# Patient Record
Sex: Male | Born: 1955 | Race: White | Hispanic: No | Marital: Married | Smoking: Never smoker
Health system: Southern US, Community
[De-identification: ages and names within clinical notes are randomized; demographics above are authoritative.]

## PROBLEM LIST (undated history)

## (undated) DIAGNOSIS — F32A Depression, unspecified: Secondary | ICD-10-CM

## (undated) DIAGNOSIS — T7840XA Allergy, unspecified, initial encounter: Secondary | ICD-10-CM

## (undated) DIAGNOSIS — G4730 Sleep apnea, unspecified: Secondary | ICD-10-CM

## (undated) DIAGNOSIS — E785 Hyperlipidemia, unspecified: Secondary | ICD-10-CM

## (undated) DIAGNOSIS — I10 Essential (primary) hypertension: Secondary | ICD-10-CM

## (undated) DIAGNOSIS — K219 Gastro-esophageal reflux disease without esophagitis: Secondary | ICD-10-CM

## (undated) DIAGNOSIS — M7550 Bursitis of unspecified shoulder: Secondary | ICD-10-CM

## (undated) DIAGNOSIS — M1990 Unspecified osteoarthritis, unspecified site: Secondary | ICD-10-CM

## (undated) DIAGNOSIS — G4733 Obstructive sleep apnea (adult) (pediatric): Secondary | ICD-10-CM

## (undated) HISTORY — DX: Unspecified osteoarthritis, unspecified site: M19.90

## (undated) HISTORY — PX: TONSILLECTOMY AND ADENOIDECTOMY: SHX28

## (undated) HISTORY — DX: Obstructive sleep apnea (adult) (pediatric): G47.33

## (undated) HISTORY — DX: Hyperlipidemia, unspecified: E78.5

## (undated) HISTORY — DX: Bursitis of unspecified shoulder: M75.50

## (undated) HISTORY — DX: Gastro-esophageal reflux disease without esophagitis: K21.9

## (undated) HISTORY — DX: Depression, unspecified: F32.A

## (undated) HISTORY — DX: Sleep apnea, unspecified: G47.30

## (undated) HISTORY — DX: Allergy, unspecified, initial encounter: T78.40XA

## (undated) HISTORY — DX: Essential (primary) hypertension: I10

---

## 2003-07-08 HISTORY — PX: ANTERIOR CRUCIATE LIGAMENT REPAIR: SHX115

## 2007-06-18 ENCOUNTER — Ambulatory Visit: Payer: Self-pay | Admitting: Family Medicine

## 2007-06-18 DIAGNOSIS — I10 Essential (primary) hypertension: Secondary | ICD-10-CM

## 2007-06-21 ENCOUNTER — Encounter: Payer: Self-pay | Admitting: Family Medicine

## 2007-06-21 LAB — CONVERTED CEMR LAB
AST: 26 units/L (ref 0–37)
Alkaline Phosphatase: 82 units/L (ref 39–117)
BUN: 18 mg/dL (ref 6–23)
Cholesterol: 222 mg/dL — ABNORMAL HIGH (ref 0–200)
Glucose, Bld: 101 mg/dL — ABNORMAL HIGH (ref 70–99)
LDL Cholesterol: 148 mg/dL — ABNORMAL HIGH (ref 0–99)
PSA: 0.28 ng/mL (ref 0.10–4.00)
Total Bilirubin: 0.9 mg/dL (ref 0.3–1.2)
Total Protein: 7.6 g/dL (ref 6.0–8.3)
Triglycerides: 150 mg/dL — ABNORMAL HIGH (ref <150)
VLDL: 30 mg/dL (ref 0–40)

## 2007-06-22 ENCOUNTER — Encounter: Payer: Self-pay | Admitting: Family Medicine

## 2007-07-30 ENCOUNTER — Ambulatory Visit: Payer: Self-pay | Admitting: Family Medicine

## 2007-07-30 DIAGNOSIS — E785 Hyperlipidemia, unspecified: Secondary | ICD-10-CM

## 2007-08-05 ENCOUNTER — Encounter: Payer: Self-pay | Admitting: Family Medicine

## 2007-08-09 LAB — CONVERTED CEMR LAB
BUN: 18 mg/dL (ref 6–23)
CO2: 22 meq/L (ref 19–32)
Calcium: 9.6 mg/dL (ref 8.4–10.5)
Glucose, Bld: 91 mg/dL (ref 70–99)
Potassium: 4.1 meq/L (ref 3.5–5.3)

## 2007-08-17 ENCOUNTER — Encounter: Payer: Self-pay | Admitting: Family Medicine

## 2007-08-17 DIAGNOSIS — G4730 Sleep apnea, unspecified: Secondary | ICD-10-CM | POA: Insufficient documentation

## 2007-09-11 ENCOUNTER — Encounter: Payer: Self-pay | Admitting: Family Medicine

## 2007-09-22 ENCOUNTER — Encounter: Payer: Self-pay | Admitting: Family Medicine

## 2007-10-29 ENCOUNTER — Ambulatory Visit: Payer: Self-pay | Admitting: Family Medicine

## 2007-10-29 DIAGNOSIS — R7301 Impaired fasting glucose: Secondary | ICD-10-CM | POA: Insufficient documentation

## 2007-11-02 ENCOUNTER — Telehealth: Payer: Self-pay | Admitting: Family Medicine

## 2008-01-31 ENCOUNTER — Ambulatory Visit: Payer: Self-pay | Admitting: Family Medicine

## 2008-02-02 ENCOUNTER — Encounter: Payer: Self-pay | Admitting: Family Medicine

## 2008-02-04 LAB — CONVERTED CEMR LAB
ALT: 27 units/L (ref 0–53)
AST: 23 units/L (ref 0–37)
BUN: 22 mg/dL (ref 6–23)
CO2: 23 meq/L (ref 19–32)
Potassium: 4.4 meq/L (ref 3.5–5.3)
Total Bilirubin: 0.7 mg/dL (ref 0.3–1.2)
Total Protein: 7.7 g/dL (ref 6.0–8.3)

## 2008-04-07 ENCOUNTER — Ambulatory Visit: Payer: Self-pay | Admitting: Family Medicine

## 2008-04-28 ENCOUNTER — Ambulatory Visit: Payer: Self-pay | Admitting: Family Medicine

## 2008-05-12 ENCOUNTER — Encounter: Payer: Self-pay | Admitting: Family Medicine

## 2008-05-31 ENCOUNTER — Encounter: Payer: Self-pay | Admitting: Family Medicine

## 2008-06-06 ENCOUNTER — Encounter: Payer: Self-pay | Admitting: Family Medicine

## 2008-07-20 ENCOUNTER — Encounter: Payer: Self-pay | Admitting: Family Medicine

## 2008-07-27 ENCOUNTER — Ambulatory Visit: Payer: Self-pay | Admitting: Family Medicine

## 2008-07-27 DIAGNOSIS — J309 Allergic rhinitis, unspecified: Secondary | ICD-10-CM | POA: Insufficient documentation

## 2009-04-19 ENCOUNTER — Ambulatory Visit: Payer: Self-pay | Admitting: Family Medicine

## 2009-04-19 DIAGNOSIS — M5416 Radiculopathy, lumbar region: Secondary | ICD-10-CM

## 2009-07-01 ENCOUNTER — Emergency Department (HOSPITAL_COMMUNITY): Admission: EM | Admit: 2009-07-01 | Discharge: 2009-07-01 | Payer: Self-pay | Admitting: Emergency Medicine

## 2009-07-20 ENCOUNTER — Encounter: Payer: Self-pay | Admitting: Family Medicine

## 2009-10-13 ENCOUNTER — Emergency Department (HOSPITAL_COMMUNITY): Admission: EM | Admit: 2009-10-13 | Discharge: 2009-10-13 | Payer: Self-pay | Admitting: Emergency Medicine

## 2009-10-19 ENCOUNTER — Encounter: Payer: Self-pay | Admitting: Family Medicine

## 2009-12-01 ENCOUNTER — Ambulatory Visit: Payer: Self-pay | Admitting: Family Medicine

## 2009-12-01 DIAGNOSIS — L02419 Cutaneous abscess of limb, unspecified: Secondary | ICD-10-CM | POA: Insufficient documentation

## 2009-12-01 DIAGNOSIS — L03119 Cellulitis of unspecified part of limb: Secondary | ICD-10-CM

## 2009-12-21 ENCOUNTER — Encounter: Payer: Self-pay | Admitting: Family Medicine

## 2010-05-04 ENCOUNTER — Encounter: Payer: Self-pay | Admitting: Family Medicine

## 2010-05-20 ENCOUNTER — Encounter: Payer: Self-pay | Admitting: Family Medicine

## 2010-06-14 ENCOUNTER — Ambulatory Visit: Payer: Self-pay | Admitting: Family Medicine

## 2010-06-14 DIAGNOSIS — J0100 Acute maxillary sinusitis, unspecified: Secondary | ICD-10-CM

## 2010-06-21 ENCOUNTER — Ambulatory Visit: Payer: Self-pay | Admitting: Family Medicine

## 2010-06-21 ENCOUNTER — Encounter: Payer: Self-pay | Admitting: Family Medicine

## 2010-06-24 LAB — CONVERTED CEMR LAB
Albumin: 4.5 g/dL (ref 3.5–5.2)
Alkaline Phosphatase: 76 units/L (ref 39–117)
CO2: 25 meq/L (ref 19–32)
Calcium: 10 mg/dL (ref 8.4–10.5)
Chloride: 103 meq/L (ref 96–112)
HDL: 40 mg/dL (ref >39)
LDL Cholesterol: 132 mg/dL — ABNORMAL HIGH (ref 0–99)
Potassium: 4.7 meq/L (ref 3.5–5.3)
Total Bilirubin: 0.5 mg/dL (ref 0.3–1.2)
Total CHOL/HDL Ratio: 4.8
Triglycerides: 97 mg/dL (ref <150)

## 2010-07-31 ENCOUNTER — Encounter: Payer: Self-pay | Admitting: Family Medicine

## 2010-08-06 NOTE — Letter (Signed)
Summary: [facility]  [facility]   Imported By: Lanelle Bal 10/31/2009 09:13:42  _____________________________________________________________________  External Attachment:    Type:   Image     Comment:   External Document

## 2010-08-06 NOTE — Letter (Signed)
Summary: [facility]  [facility]   Imported By: Lanelle Bal 06/03/2010 09:59:21  _____________________________________________________________________  External Attachment:    Type:   Image     Comment:   External Document

## 2010-08-06 NOTE — Letter (Signed)
Summary: [facility]  [facility]   Imported By: Lanelle Bal 08/07/2009 13:51:41  _____________________________________________________________________  External Attachment:    Type:   Image     Comment:   External Document

## 2010-08-06 NOTE — Assessment & Plan Note (Signed)
Summary: Bites on L Lower Leg x 3 dys rm 3   Vital Signs:  Patient Profile:   55 Years Old Male CC:      Bite on L Lower leg x 3dys Height:     73.1 inches Weight:      275 pounds O2 Sat:      100 % O2 treatment:    Room Air Temp:     97.0 degrees F oral Pulse rate:   71 / minute Pulse rhythm:   regular Resp:     18 per minute BP sitting:   125 / 88  (right arm) Cuff size:   regular  Vitals Entered By: Mitchell Medina CMA (Dec 01, 2009 9:19 AM)                  Current Allergies: No known allergies History of Present Illness Chief Complaint: Bite on L Lower leg x 3dys History of Present Illness: Subjective:  Patient complains of 2 day history of "bug bites" right lower leg.  He feels well; no fevers, chills, and sweats.  Area in question is slightly sore but not painful. He is s/p right knee surgery, discharged from [facility] on 10/19/09 (orthopedist Dr. Heinz Medina).  He underwent a medial meniscocapsular separation injury repair, MCL ligament repair, and patella tendon repair, and has been recovering without incident. His wife reports that he has had a MRSA infection in the past.  Current Problems: CELLULITIS, LEG, RIGHT (ICD-682.6) LUMBAR STRAIN (ICD-847.2) ALLERGIC RHINITIS (ICD-477.9) IMPAIRED FASTING GLUCOSE (ICD-790.21) SLEEP APNEA (ICD-780.57) DYSLIPIDEMIA (ICD-272.4) ESSENTIAL HYPERTENSION, BENIGN (ICD-401.1)   Current Meds LISINOPRIL-HYDROCHLOROTHIAZIDE 20-12.5 MG  TABS (LISINOPRIL-HYDROCHLOROTHIAZIDE) 1 tab by mouth daily LOVASTATIN 40 MG  TABS (LOVASTATIN) 1 tab by mouth qhs ASPIRIN ADULT LOW STRENGTH 81 MG  TBEC (ASPIRIN) 1 tab by mouth daily NASONEX 50 MCG/ACT SUSP (MOMETASONE FUROATE) 2 sprays per nostril daily ETODOLAC 500 MG TABS (ETODOLAC) 1 tab by mouth two times a day with food x 10 days CYCLOBENZAPRINE HCL 10 MG TABS (CYCLOBENZAPRINE HCL) 1 tab by mouth at bedtime as needed back pain LOVENOX 30 MG/0.3ML SOLN (ENOXAPARIN SODIUM) 1  tab by mouth once daily MINOCYCLINE HCL 100 MG CAPS (MINOCYCLINE HCL) 1 by mouth two times a day  REVIEW OF SYSTEMS Constitutional Symptoms      Denies fever, chills, night sweats, weight loss, weight gain, and fatigue.  Eyes       Denies change in vision, eye pain, eye discharge, glasses, contact lenses, and eye surgery. Ear/Nose/Throat/Mouth       Denies hearing loss/aids, change in hearing, ear pain, ear discharge, dizziness, frequent runny nose, frequent nose bleeds, sinus problems, sore throat, hoarseness, and tooth pain or bleeding.  Respiratory       Denies dry cough, productive cough, wheezing, shortness of breath, asthma, bronchitis, and emphysema/COPD.  Cardiovascular       Denies murmurs, chest pain, and tires easily with exhertion.    Gastrointestinal       Denies stomach pain, nausea/vomiting, diarrhea, constipation, blood in bowel movements, and indigestion. Genitourniary       Denies painful urination, kidney stones, and loss of urinary control. Neurological       Denies paralysis, seizures, and fainting/blackouts. Musculoskeletal       Denies muscle pain, joint pain, joint stiffness, decreased range of motion, redness, swelling, muscle weakness, and gout.  Skin       Denies bruising, unusual mles/lumps or sores, and hair/skin or nail changes.  Psych  Denies mood changes, temper/anger issues, anxiety/stress, speech problems, depression, and sleep problems. Other Comments: Bites on L Lower leg x  3dys. Pt has not seen PCP for this.   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 Lives with Mitchell Medina.  No children.  Never smoked. 2 ETOH/ month.   Exercises 2 days a wk. Fair diet.    Risk Factors: Caffeine Use: 0 (July 17, 2007)   Objective:  No acute distress  Right Knee:  multiple  healed surgical wounds without swelling, tenderness, erythema, or warmth Right lower leg pre-tibial area:  5cm patch of erythema with several 1 to 2mm dia pustules present.  No swelling or fluctuance.  Minimal lower leg edema.  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  AREA OF CELLULITIS APPEARS TO BE AT A CONTACT POINT FOR HIS HINGED KNEE BRACE.  Plan New Medications/Changes: MINOCYCLINE HCL 100 MG CAPS (MINOCYCLINE HCL) 1 by mouth two times a day  #20 x 0, 12/01/2009, Mitchell Christen MD  New Orders: T-Culture, Wound [[id number]] New Patient Level III [phone] Planning Comments:   Area of cellulitis outlined with marking pen.   Wound culture taken.  Bandage applied (advised to keep bandage on wound and change daily). Begin minocycline 100mg  two times a day for 10 days Recommend that he remove contact pads from his knee brace and wash in warm water with detergent and Chlorox bleach solution, then dry thoroughly Return (or go to ER) for increasing pain, swelling, fever, and especially any pain/swelling in right knee. For the next 2 to 3 days, recommend that he minimize ambulation, elevate leg, and apply dry heat in order to maximize circulation to the site of cellulitis. Recommend that he follow-up with his orthopedist in 2 to 4 days.   The patient and/or caregiver has been counseled thoroughly with regard to medications prescribed including dosage, schedule, interactions, rationale for use, and possible side effects and they verbalize understanding.  Diagnoses and expected course of recovery discussed and will return if not improved as expected or if the  condition worsens. Patient and/or caregiver verbalized understanding.  Prescriptions: MINOCYCLINE HCL 100 MG CAPS (MINOCYCLINE HCL) 1 by mouth two times a day  #20 x 0   Entered and Authorized by:   Mitchell Christen MD   Signed by:   Mitchell Christen MD on 12/01/2009   Method used:   Print then Give to Patient   RxID:   [id number]

## 2010-08-06 NOTE — Assessment & Plan Note (Signed)
Summary: sinusitis   Vital Signs:  Patient profile:   55 year old male Height:      73.1 inches Weight:      265 pounds BMI:     34.99 O2 Sat:      99 % on Room air Temp:     98.2 degrees F oral Pulse rate:   76 / minute BP sitting:   139 / 89  (left arm) Cuff size:   regular  Vitals Entered By: Payton Spark CMA (June 14, 2010 3:54 PM)  O2 Flow:  Room air CC: Congestion, ST and cough x 2 days.   Primary Care Desmon Hitchner:  Seymour Bars DO  CC:  Congestion and ST and cough x 2 days.Marland Kitchen  History of Present Illness: 55 yo WM presents for sore throat, congestion and cough x 2 days.  He has had several sinus infections.  He is having HAs.  Had f/c last night.  He is taking Aspirin and Vitamin C.  The cough kept him up last night.  Has sinus pressure behind his nose.  Has mostly clear/ yellow rhinorrhea.  No sick contacts.  No GI upset but is having more frequent BMs.  He has had frequent sinus infection with yearlong allergies.  Has been taking Claritin D but it seems to not be helping for his allergies this year.      Current Medications (verified): 1)  Lisinopril-Hydrochlorothiazide 20-12.5 Mg  Tabs (Lisinopril-Hydrochlorothiazide) .Marland Kitchen.. 1 Tab By Mouth Daily 2)  Lovastatin 40 Mg  Tabs (Lovastatin) .Marland Kitchen.. 1 Tab By Mouth Qhs 3)  Aspirin Adult Low Strength 81 Mg  Tbec (Aspirin) .Marland Kitchen.. 1 Tab By Mouth Daily 4)  Claritin-D 24 Hour 10-240 Mg Xr24h-Tab (Loratadine-Pseudoephedrine)  Allergies (verified): No Known Drug Allergies  Past History:  Past Medical History: Reviewed history from 07/27/2008 and no changes required. HTN high chol on CPAP for OSA (severe) hx of childhood asthma hx of shoulder bursitis chronic allergies  Past Surgical History: ACL removed 2005 (L); ACL repair on the R 2011 T&A  Social History: Reviewed history from 06/18/2007 and no changes required. 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 Lives with [name].  No children.  Never  smoked. 2 ETOH/ month.   Exercises 2 days a wk. Fair diet.    Review of Systems      See HPI  Physical Exam  General:  alert, well-developed, well-nourished, and well-hydrated.   Head:  normocephalic and atraumatic.  maxilary sinuses NTTP Eyes:  conjunctiva clear; slightly watery Ears:  EACs patent; TMs translucent and gray with good cone of light and bony landmarks.  Nose:  nasal congestion with clear rhinorrhea and boggy turbinates Mouth:  o/p injected with cobblestoning. Lungs:  normal respiratory effort, no intercostal retractions, no accessory muscle use, and normal breath sounds.   Heart:  normal rate, regular rhythm, and no murmur.   Extremities:  no LE edema Skin:  color normal.   Cervical Nodes:  shotty anterior cervical chain LA Psych:  good eye contact, not anxious appearing, and not depressed appearing.     Impression & Recommendations:  Problem # 1:  ACUTE MAXILLARY SINUSITIS (ICD-461.0) Likely secondary to poorly treated underlying allergies and URI.   Will start on Amoxicillin x 10 days + Mucinex D in AM and RX cough syrup at night. Call if not improved after 10 days. The following medications were removed from the medication list:    Nasonex 50 Mcg/act Susp (Mometasone furoate) .Marland KitchenMarland KitchenMarland KitchenMarland Kitchen 2  sprays per nostril daily    Minocycline Hcl 100 Mg Caps (Minocycline hcl) .Marland Kitchen... 1 by mouth two times a day His updated medication list for this problem includes:    Claritin-d 24 Hour 10-240 Mg Xr24h-tab (Loratadine-pseudoephedrine)    Amoxicillin 875 Mg Tabs (Amoxicillin) .Marland Kitchen... 1 tab by mouth two times a day x 10 days    Promethazine Vc/codeine 6.25-5-10 Mg/15ml Syrp (Phenyleph-promethazine-cod) .Marland Kitchen... 5-10 ml by mouth q hs as needed cough/ congestion    Mucinex D 60-600 Mg Xr12h-tab (Pseudoephedrine-guaifenesin) .Marland Kitchen... 1 tab by mouth qam  Complete Medication List: 1)  Lisinopril-hydrochlorothiazide 20-12.5 Mg Tabs (Lisinopril-hydrochlorothiazide) .Marland Kitchen.. 1 tab by mouth daily 2)   Lovastatin 40 Mg Tabs (Lovastatin) .Marland Kitchen.. 1 tab by mouth qhs 3)  Aspirin Adult Low Strength 81 Mg Tbec (Aspirin) .Marland Kitchen.. 1 tab by mouth daily 4)  Claritin-d 24 Hour 10-240 Mg Xr24h-tab (Loratadine-pseudoephedrine) 5)  Amoxicillin 875 Mg Tabs (Amoxicillin) .Marland Kitchen.. 1 tab by mouth two times a day x 10 days 6)  Promethazine Vc/codeine 6.25-5-10 Mg/16ml Syrp (Phenyleph-promethazine-cod) .... 5-10 ml by mouth q hs as needed cough/ congestion 7)  Mucinex D 60-600 Mg Xr12h-tab (Pseudoephedrine-guaifenesin) .Marland Kitchen.. 1 tab by mouth qam  Patient Instructions: 1)  Take Mucinex D + Advil in the morning. 2)  Use RX cough syrup at bedtime with more Advil if needed. 3)  Take 10 days of Amoxicillin for sinusitis. 4)  Hold Claritin D for now. Prescriptions: PROMETHAZINE VC/CODEINE 6.25-5-10 MG/5ML SYRP (PHENYLEPH-PROMETHAZINE-COD) 5-10 ml by mouth q hs as needed cough/ congestion  #150 ml x 0   Entered and Authorized by:   Seymour Bars DO   Signed by:   Seymour Bars DO on 06/14/2010   Method used:   Printed then faxed to ...       [facility] [phone]* (retail)       [address] [place], [place]  [address]       Ph: [phone]       Fax: [phone]   RxID:   [id number] AMOXICILLIN 875 MG TABS (AMOXICILLIN) 1 tab by mouth two times a day x 10 days  #20 x 0   Entered and Authorized by:   Seymour Bars DO   Signed by:   Seymour Bars DO on 06/14/2010   Method used:   Electronically to        [facility] [phone]* (retail)       [address] [place], [place]  [address]       Ph: [phone]       Fax: [phone]   RxID:   [id number]    Orders Added: 1)  Est. Patient Level III [62376]
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
## 2010-08-15 ENCOUNTER — Encounter: Payer: Self-pay | Admitting: Family Medicine

## 2010-08-15 ENCOUNTER — Ambulatory Visit (INDEPENDENT_AMBULATORY_CARE_PROVIDER_SITE_OTHER): Payer: BC Managed Care – PPO | Admitting: Family Medicine

## 2010-08-15 DIAGNOSIS — E785 Hyperlipidemia, unspecified: Secondary | ICD-10-CM

## 2010-08-15 DIAGNOSIS — R7301 Impaired fasting glucose: Secondary | ICD-10-CM

## 2010-08-15 DIAGNOSIS — I10 Essential (primary) hypertension: Secondary | ICD-10-CM

## 2010-08-15 LAB — CONVERTED CEMR LAB: LDL Goal: 130 mg/dL

## 2010-08-16 ENCOUNTER — Ambulatory Visit: Payer: Self-pay | Admitting: Family Medicine

## 2010-08-16 LAB — CONVERTED CEMR LAB
Bilirubin, Direct: 0.1 mg/dL (ref 0.0–0.3)
Cholesterol: 142 mg/dL (ref 0–200)
HDL: 38 mg/dL — ABNORMAL LOW (ref >39)
LDL Cholesterol: 69 mg/dL (ref 0–99)
Total Bilirubin: 0.6 mg/dL (ref 0.3–1.2)
Total CHOL/HDL Ratio: 3.7
Total Protein: 7.2 g/dL (ref 6.0–8.3)
VLDL: 35 mg/dL (ref 0–40)

## 2010-08-22 NOTE — Assessment & Plan Note (Signed)
Summary: f/u cholesterol   Vital Signs:  Patient profile:   55 year old male Height:      73.1 inches Weight:      262 pounds BMI:     34.60 O2 Sat:      100 % on Room air Pulse rate:   61 / minute BP sitting:   123 / 83  (left arm) Cuff size:   large  Vitals Entered By: Payton Spark CMA (August 15, 2010 8:47 AM)  O2 Flow:  Room air CC: F/U cholesterol , Lipid Management, Hypertension Management   CC:  F/U cholesterol , Lipid Management, and Hypertension Management.  Hypertension History:      He denies chest pain, dyspnea with exertion, peripheral edema, visual symptoms, and side effects from treatment.  He notes no problems with any antihypertensive medication side effects.        Positive major cardiovascular risk factors include male age 11 years old or older, hyperlipidemia, and hypertension.  Negative major cardiovascular risk factors include no history of diabetes and negative family history for ischemic heart disease.        Further assessment for target organ damage reveals no history of ASHD, cardiac end-organ damage (CHF/LVH), stroke/TIA, peripheral vascular disease, renal insufficiency, or hypertensive retinopathy.    Lipid Management History:      Positive NCEP/ATP III risk factors include male age 36 years old or older and hypertension.  Negative NCEP/ATP III risk factors include non-diabetic, no family history for ischemic heart disease, no ASHD (atherosclerotic heart disease), no prior stroke/TIA, no peripheral vascular disease, and no history of aortic aneurysm.        Adjunctive measures started by the patient include aerobic exercise and weight reduction.  He expresses no side effects from his lipid-lowering medication.  The patient denies any symptoms to suggest myopathy or liver disease.      Current Medications (verified): 1)  Lisinopril-Hydrochlorothiazide 20-12.5 Mg  Tabs (Lisinopril-Hydrochlorothiazide) .Marland Kitchen.. 1 Tab By Mouth Daily 2)  Crestor 10 Mg Tabs  (Rosuvastatin Calcium) .Marland Kitchen.. 1 Tab By Mouth Qhs 3)  Aspirin Adult Low Strength 81 Mg  Tbec (Aspirin) .Marland Kitchen.. 1 Tab By Mouth Daily 4)  Claritin-D 24 Hour 10-240 Mg Xr24h-Tab (Loratadine-Pseudoephedrine) 5)  Omnaris 50 Mcg/act Susp (Ciclesonide) .... 2 Sp / Nostril Daily  Allergies (verified): No Known Drug Allergies  Physical Exam  General:  alert, well-developed, well-nourished, well-hydrated, and overweight-appearing.   Lungs:  Normal respiratory effort, chest expands symmetrically. Lungs are clear to auscultation, no crackles or wheezes. Heart:  Normal rate and regular rhythm. S1 and S2 normal without gallop, murmur, click, rub or other extra sounds.   Impression & Recommendations:  Problem # 1:  DYSLIPIDEMIA (ICD-272.4)  Repeat FLP with Liver function today given new start Crestor 8 wks ago.  F/U results tomorrow.   His updated medication list for this problem includes:    Crestor 10 Mg Tabs (Rosuvastatin calcium) .Marland Kitchen... 1 tab by mouth qhs  Orders: T-Lipid Profile [phone]) T-Liver Profile [phone])  Labs Reviewed: SGOT: 21 (06/21/2010)   SGPT: 26 (06/21/2010)  Prior 10 Yr Risk Heart Disease: 9 % (07/30/2007)   HDL:40 (06/21/2010), 44 (06/21/2007)  LDL:132 (06/21/2010), 148 (06/21/2007)  Chol:191 (06/21/2010), 222 (06/21/2007)  Trig:97 (06/21/2010), 150 (06/21/2007)  Problem # 2:  ESSENTIAL HYPERTENSION, BENIGN (ICD-401.1) BP looks great!  Stay on current meds.  Labs UTD. His updated medication list for this problem includes:    Lisinopril-hydrochlorothiazide 20-12.5 Mg Tabs (Lisinopril-hydrochlorothiazide) .Marland Kitchen... 1 tab by mouth daily  BP today: 123/83 Prior BP: 135/90 (06/21/2010)  Prior 10 Yr Risk Heart Disease: 9 % (07/30/2007)  Labs Reviewed: K+: 4.7 (06/21/2010) Creat: : 0.97 (06/21/2010)   Chol: 191 (06/21/2010)   HDL: 40 (06/21/2010)   LDL: 132 (06/21/2010)   TG: 97 (06/21/2010)  Problem # 3:  IMPAIRED FASTING GLUCOSE (ICD-790.21) FAsting glucose 102 on Dec  2011 labs.   BMI 34.6 c/w class I obesity. He is eating healthier and exercising 7 days/ wk.  Recheck CBG at 6 mos f/u visit.  Complete Medication List: 1)  Lisinopril-hydrochlorothiazide 20-12.5 Mg Tabs (Lisinopril-hydrochlorothiazide) .Marland Kitchen.. 1 tab by mouth daily 2)  Crestor 10 Mg Tabs (Rosuvastatin calcium) .Marland Kitchen.. 1 tab by mouth qhs 3)  Aspirin Adult Low Strength 81 Mg Tbec (Aspirin) .Marland Kitchen.. 1 tab by mouth daily 4)  Claritin-d 24 Hour 10-240 Mg Xr24h-tab (Loratadine-pseudoephedrine) 5)  Omnaris 50 Mcg/act Susp (Ciclesonide) .... 2 sp / nostril daily  Hypertension Assessment/Plan:      The patient's hypertensive risk group is category B: At least one risk factor (excluding diabetes) with no target organ damage.  His calculated 10 year risk of coronary heart disease is 9 %.  Today's blood pressure is 123/83.  His blood pressure goal is < 140/90.  Lipid Assessment/Plan:      Based on NCEP/ATP III, the patient's risk factor category is "2 or more risk factors and a calculated 10 year CAD risk of < 20%".  The patient's lipid goals are as follows: Total cholesterol goal is 200; LDL cholesterol goal is 130; HDL cholesterol goal is 40; Triglyceride goal is 150.    Patient Instructions: 1)  Labs today. 2)  Will call you w/ results tomorrow. 3)  BP looks great. 4)  Keep up the good work with healthy diet and regular exercise! 5)  Return for f/u in 6 mos.   Orders Added: 1)  T-Lipid Profile [[id number]] 2)  T-Liver Profile [[id number]] 3)  Est. Patient Level III [04540]

## 2010-08-28 NOTE — Letter (Signed)
Summary: [facility]  [facility]   Imported By: Lanelle Bal 08/20/2010 13:20:41  _____________________________________________________________________  External Attachment:    Type:   Image     Comment:   External Document

## 2010-12-15 ENCOUNTER — Encounter: Payer: Self-pay | Admitting: Family Medicine

## 2010-12-20 ENCOUNTER — Encounter: Payer: Self-pay | Admitting: Family Medicine

## 2010-12-20 ENCOUNTER — Ambulatory Visit (INDEPENDENT_AMBULATORY_CARE_PROVIDER_SITE_OTHER): Payer: BC Managed Care – PPO | Admitting: Family Medicine

## 2010-12-20 DIAGNOSIS — R7301 Impaired fasting glucose: Secondary | ICD-10-CM

## 2010-12-20 DIAGNOSIS — E785 Hyperlipidemia, unspecified: Secondary | ICD-10-CM

## 2010-12-20 DIAGNOSIS — I10 Essential (primary) hypertension: Secondary | ICD-10-CM

## 2010-12-20 DIAGNOSIS — J309 Allergic rhinitis, unspecified: Secondary | ICD-10-CM

## 2010-12-20 LAB — GLUCOSE, POCT (MANUAL RESULT ENTRY): POC Glucose: 120

## 2010-12-20 NOTE — Assessment & Plan Note (Signed)
BP at goal on lisinopril - hctz.  Continue.  Labs UTD.

## 2010-12-20 NOTE — Assessment & Plan Note (Signed)
Doing well on crestor.  Labs UTD.  Continue.

## 2010-12-20 NOTE — Assessment & Plan Note (Signed)
Yearlong allergies since childhood. Change Claritin D to Zyrtec.  Did not see any benefit from [facility].  Given h/o on reducing allergen exposure.

## 2010-12-20 NOTE — Patient Instructions (Signed)
Stay on current meds.  Fasting sugar 120.  > 126 is the cut-off for diabetes.  Work on low sugar/ low carb diet + regular exercise.  REturn for nurse visit -- recheck sugar + A1C in 4 wks.  Change Claritin D to Zyrtec once daily.

## 2010-12-20 NOTE — Assessment & Plan Note (Signed)
Glucose 120 today c/w IFG, higher from previous.  Time to really adjust diet to low sugar/ low carb, continue regular exercise. Recheck with A1C here in 4 wks.  Given book for assistance on dietary changes.

## 2010-12-20 NOTE — Progress Notes (Signed)
  Subjective:    Patient ID: Mitchell Medina, male    DOB: [birth year], 55 y.o.   MRN: [id number]  HPI 55 yo WM presents for f/u HTN and high cholesterol.  He is struggling with seasonal allergies.  He is on Claritin D which has not been helping much this year for his yearlong allergies.  Denies chest pain, DOE.  He denies chest tightness, wheezing or nocturnal cough and has not had problems with asthma since childhood.  He is doing well with CPAP, Lisinopril/ HCTZ and Crestor.  Labs are UTD but we wanted to recheck his sugar which was in the IFG range last time.  He is exercising 5-6 days/ wk but has not changed his diet.  BP 122/79  Pulse 65  Ht 6' 0.75" (1.848 m)  Wt 259 lb (117.482 kg)  BMI 34.41 kg/m2  SpO2 100%   Review of Systems  Constitutional: Negative for fatigue and unexpected weight change.  HENT: Positive for congestion, rhinorrhea, sneezing and postnasal drip. Negative for ear pain.   Eyes: Negative for visual disturbance.  Respiratory: Negative for cough and shortness of breath.   Cardiovascular: Negative for chest pain, palpitations and leg swelling.  Genitourinary: Negative for frequency.       Objective:   Physical Exam  Constitutional: He appears well-developed and well-nourished.       overwt  HENT:  Nose: Nose normal.  Mouth/Throat: Oropharynx is clear and moist.  Eyes: Conjunctivae are normal. No scleral icterus.  Neck: Neck supple. No thyromegaly present.  Cardiovascular: Normal rate, regular rhythm and normal heart sounds.   No murmur heard. Pulmonary/Chest: Effort normal and breath sounds normal. No respiratory distress. He has no wheezes.  Abdominal: Soft. Bowel sounds are normal. He exhibits no distension. There is no tenderness.       No AA bruits  Musculoskeletal: He exhibits no edema.  Lymphadenopathy:    He has no cervical adenopathy.  Skin: Skin is warm and dry.  Psychiatric: He has a normal mood and affect.          Assessment & Plan:

## 2011-01-13 ENCOUNTER — Other Ambulatory Visit: Payer: Self-pay | Admitting: *Deleted

## 2011-01-13 MED ORDER — LISINOPRIL-HYDROCHLOROTHIAZIDE 20-12.5 MG PO TABS: 1.0000 | ORAL_TABLET | Freq: Every day | ORAL | Status: DC

## 2011-01-17 ENCOUNTER — Ambulatory Visit (INDEPENDENT_AMBULATORY_CARE_PROVIDER_SITE_OTHER): Payer: BC Managed Care – PPO | Admitting: Family Medicine

## 2011-01-17 ENCOUNTER — Telehealth: Payer: Self-pay | Admitting: Family Medicine

## 2011-01-17 DIAGNOSIS — J302 Other seasonal allergic rhinitis: Secondary | ICD-10-CM

## 2011-01-17 DIAGNOSIS — R7301 Impaired fasting glucose: Secondary | ICD-10-CM

## 2011-01-17 DIAGNOSIS — J309 Allergic rhinitis, unspecified: Secondary | ICD-10-CM

## 2011-01-17 LAB — GLUCOSE, POCT (MANUAL RESULT ENTRY): POC Glucose: 112

## 2011-01-17 NOTE — Telephone Encounter (Signed)
Pls let pt know that his A1C looks great at 5.9 incidating great control if his sugar.  Keep up the good work

## 2011-01-17 NOTE — Telephone Encounter (Signed)
Pt aware of the above  

## 2011-01-17 NOTE — Progress Notes (Signed)
  Subjective:    Patient ID: Mitchell Medina, male    DOB: [birth year], 55 y.o.   MRN: [id number]  HPI  Repeat fasting sugar and A1C due to elevated sugar last month. Pt also would like a suggestion on allergy med. He has tried claritin D and zyrtec D w/out relief. Please advise.    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 He needs to work on regular exercise, low sugar and low carb diet. Recheck lab work in 6 months.  Allergies he can try Allegra which is now over-the-counter. If this is not helping his symptoms then he probably needs an office visit with Dr. Cathey Endow to see if he needs to have a nasal steroid or other medications added to his regimen.

## 2011-02-10 ENCOUNTER — Ambulatory Visit (INDEPENDENT_AMBULATORY_CARE_PROVIDER_SITE_OTHER): Payer: BC Managed Care – PPO | Admitting: Family Medicine

## 2011-02-10 ENCOUNTER — Encounter: Payer: Self-pay | Admitting: Family Medicine

## 2011-02-10 VITALS — BP 118/79 | HR 79 | Temp 98.4°F | Wt 260.0 lb

## 2011-02-10 DIAGNOSIS — J019 Acute sinusitis, unspecified: Secondary | ICD-10-CM

## 2011-02-10 MED ORDER — ALBUTEROL SULFATE HFA 108 (90 BASE) MCG/ACT IN AERS: 2.0000 | INHALATION_SPRAY | Freq: Four times a day (QID) | RESPIRATORY_TRACT | Status: DC | PRN

## 2011-02-10 MED ORDER — AMOXICILLIN 875 MG PO TABS: 875.0000 mg | ORAL_TABLET | Freq: Two times a day (BID) | ORAL | Status: AC

## 2011-02-10 NOTE — Progress Notes (Signed)
  Subjective:    Patient ID: Mitchell Medina, male    DOB: 10/03/1955, 55 y.o.   MRN: [id number]  HPI 55 yo WM presents for a scratchy throat and and runny nose x 2 days.  He became very congested today. No F/C/  Has a HA today.  He has pressure bhind his eyes.  He has a hx of allergies and recurrent sinus infections.  He has never had sinus surgery.  He is taking Claritin and Tylenol.  Has a little cough.  No SOB but has a little chest tightness.  He has a hx of asthma but ran out of Albuterol HFA.  BP 118/79  Pulse 79  Temp(Src) 98.4 F (36.9 C) (Oral)  Wt 260 lb (117.935 kg)  SpO2 98%    Review of Systems  Constitutional: Positive for fatigue. Negative for fever and chills.  HENT: Positive for congestion, sore throat, rhinorrhea, postnasal drip and sinus pressure.   Eyes: Negative for itching.  Respiratory: Positive for chest tightness, shortness of breath and wheezing. Negative for cough.   Cardiovascular: Negative for chest pain and palpitations.  Gastrointestinal: Negative for nausea, abdominal pain and diarrhea.  Skin: Negative for rash.  Neurological: Positive for headaches.       Objective:   Physical Exam  Constitutional: He appears well-developed and well-nourished. No distress.  HENT:  Right Ear: External ear normal.  Left Ear: External ear normal.  Nose: Mucosal edema and rhinorrhea present. Right sinus exhibits maxillary sinus tenderness. Right sinus exhibits no frontal sinus tenderness. Left sinus exhibits maxillary sinus tenderness. Left sinus exhibits no frontal sinus tenderness.  Mouth/Throat: Posterior oropharyngeal erythema present. No oropharyngeal exudate or posterior oropharyngeal edema.  Eyes: Conjunctivae are normal.  Neck: Neck supple.  Cardiovascular: Normal rate, regular rhythm and normal heart sounds.   No murmur heard. Pulmonary/Chest: Effort normal and breath sounds normal. No respiratory distress. He has no wheezes.  Lymphadenopathy:    He has  cervical adenopathy.  Skin: Skin is warm and dry. No rash noted.          Assessment & Plan:  ABS likely secondary to underlying chronic allergies but may also have a viral nasopharyngitis. Will treat with Amoxicillin 875 mg bid x 10 days, added Veramyst nasal daily since he cannot take decongestants with HTN. Use tylenol or ibuprofen for aches/ pains.  Rest, clear luids and add Albterol HFA as needed for chest tightness and wheezing. Call if any problems.

## 2011-02-10 NOTE — Patient Instructions (Signed)
Stay on claritin daily. Use tylenol or ibuprofen for pain.  Take Amoxicillin 2 x a day for 10 days for sinusitis.  Take with food. Use nasal spray daily x 2 wks.  Use ProAir inhaler as needed for chest tightness and wheezing.  Call if not improving and/ or getting any worse.

## 2011-03-07 ENCOUNTER — Ambulatory Visit (INDEPENDENT_AMBULATORY_CARE_PROVIDER_SITE_OTHER): Payer: BC Managed Care – PPO | Admitting: Family Medicine

## 2011-03-07 ENCOUNTER — Telehealth: Payer: Self-pay | Admitting: Family Medicine

## 2011-03-07 ENCOUNTER — Encounter: Payer: Self-pay | Admitting: Family Medicine

## 2011-03-07 ENCOUNTER — Other Ambulatory Visit: Payer: Self-pay | Admitting: Emergency Medicine

## 2011-03-07 ENCOUNTER — Ambulatory Visit
Admission: RE | Admit: 2011-03-07 | Discharge: 2011-03-07 | Disposition: A | Discharge status: Home / Self Care | Payer: BC Managed Care – PPO | Source: Ambulatory Visit | Attending: Family Medicine | Admitting: Family Medicine

## 2011-03-07 VITALS — BP 118/80 | HR 66 | Wt 254.0 lb

## 2011-03-07 DIAGNOSIS — M545 Low back pain: Secondary | ICD-10-CM

## 2011-03-07 DIAGNOSIS — Z23 Encounter for immunization: Secondary | ICD-10-CM

## 2011-03-07 NOTE — Patient Instructions (Signed)
Restart Naproxen bid with food and water. Stop if any GI iritation Given flu vaccine today. We will call you with your xray results.

## 2011-03-07 NOTE — Progress Notes (Signed)
  Subjective:    Patient ID: Mitchell Medina, male    DOB: [birth year], 55 y.o.   MRN: [id number]  HPI Back pain on and off for 10 days. Usually resolves in 3-5 days but now had for a week. No known triggers with the episode. Did take some naproxen a few times.  Does a lot of stretches and uses an inversion table.  No initial injury 10 years ago. Pain is lumbar.  Can shot up the back but not usually down the legs. He feels it is more comfortable to stand and walk but feels it is affecting his gait. Has been doing a lot of core exercises. Says he would like to know exactly what going on. Today his pain is moderate. He actually has a little bit better today than earlier this week.    Review of Systems     Objective:   Physical Exam  Musculoskeletal:       Dec lumbar spine flexion, NOrmal extension, rotation, and side bending. Nontender over the lumbar spine, SI joints or paraspinous muscles. NEg straight leg raise but did have pain in his back with this.  Hip, knee, and ankle sterngth 5/5. Patellar reflexes 0 + bilat.    No extremity swelling. In general no acute distress. He was able to get up on examination table without any difficulty.       Assessment & Plan:  Acute on chronic lumbar back pain. NSAID, warned against GI effects.  Recommend gentle stretches. Offered PT. He wanted to hold off for one more week.  Will call. Will start with xray. If fairly normal then consider MRI for further eval. I did explain to him that an MRI may or may not change her actual therapy course. In is most likely that he does have some type of herniated disc. I discussed with him that the most important thing is to do his stretches and exercises and really strengthen his course so that he has fewer recurrences of pain but with a history of chronic back pain he will always have recurrences of back pain in the future. Our goal is to control these as much as possible. He declined a muscle relaxer.    Given flu  vaccine today.

## 2011-03-07 NOTE — Telephone Encounter (Signed)
Pt.notified

## 2011-03-07 NOTE — Telephone Encounter (Signed)
Call pt: Degenerative changes in the lumbar spine on the xray. Does he wasn't to move forward with an MRI?

## 2011-03-12 ENCOUNTER — Telehealth: Payer: Self-pay | Admitting: *Deleted

## 2011-03-12 DIAGNOSIS — M545 Low back pain: Secondary | ICD-10-CM

## 2011-03-12 NOTE — Telephone Encounter (Signed)
MRI entered 

## 2011-03-12 NOTE — Telephone Encounter (Signed)
Pt calls and would like to proceed with getting the MRI. Please enter order or referral.

## 2011-03-14 ENCOUNTER — Ambulatory Visit
Admission: RE | Admit: 2011-03-14 | Discharge: 2011-03-14 | Disposition: A | Discharge status: Home / Self Care | Payer: BC Managed Care – PPO | Source: Ambulatory Visit | Attending: Family Medicine | Admitting: Family Medicine

## 2011-03-14 DIAGNOSIS — M545 Low back pain: Secondary | ICD-10-CM

## 2011-03-17 ENCOUNTER — Telehealth: Payer: Self-pay | Admitting: Family Medicine

## 2011-03-17 NOTE — Telephone Encounter (Signed)
Pt aware and would like referral to ortho

## 2011-03-17 NOTE — Telephone Encounter (Signed)
Call patient: He does have disc protrusions at L4-L5 and L5-S1. These are producing some on the thecal sac. They felt like him to consider seeing an orthopedist or neurosurgeon for evaluation to see if they recommend any further treatment. Please let me know if he has a preference.

## 2011-09-29 ENCOUNTER — Encounter: Payer: Self-pay | Admitting: Family Medicine

## 2011-09-29 ENCOUNTER — Ambulatory Visit (INDEPENDENT_AMBULATORY_CARE_PROVIDER_SITE_OTHER): Payer: Self-pay | Admitting: Family Medicine

## 2011-09-29 VITALS — BP 142/80 | HR 71 | Temp 98.4°F | Ht 72.75 in | Wt 267.0 lb

## 2011-09-29 DIAGNOSIS — J329 Chronic sinusitis, unspecified: Secondary | ICD-10-CM

## 2011-09-29 DIAGNOSIS — J309 Allergic rhinitis, unspecified: Secondary | ICD-10-CM

## 2011-09-29 MED ORDER — LEVOCETIRIZINE DIHYDROCHLORIDE 5 MG PO TABS: 5.0000 mg | ORAL_TABLET | Freq: Every evening | ORAL | Status: DC

## 2011-09-29 MED ORDER — AMOXICILLIN-POT CLAVULANATE 875-125 MG PO TABS: 1.0000 | ORAL_TABLET | Freq: Two times a day (BID) | ORAL | Status: AC

## 2011-09-29 MED ORDER — FLUTICASONE PROPIONATE 50 MCG/ACT NA SUSP: 2.0000 | Freq: Every day | NASAL | Status: DC

## 2011-09-29 NOTE — Progress Notes (Signed)
  Subjective:    Patient ID: Mitchell Medina, male    DOB: 1956/03/31, 56 y.o.   MRN: [id number]  HPI Sinus pain bilaterally but mostly on he right over the eye. Yellow/green discharge. + cough, post nasal drip.  No fever.  Not taking any meds.  Sinus rinses.  Right ear is pressure, but no pain. No GI sxs.  + hx of allergies. Was taking Claritin - D.  Says has tried [employer] and zyrtec.  Did allergy shots.    Review of Systems     Objective:   Physical Exam  Constitutional: He is oriented to person, place, and time. He appears well-developed and well-nourished.  HENT:  Head: Normocephalic and atraumatic.  Right Ear: External ear normal.  Left Ear: External ear normal.  Nose: Nose normal.  Mouth/Throat: Oropharynx is clear and moist.       TMs and canals are clear. Non tendera over the face.   Eyes: Conjunctivae and EOM are normal. Pupils are equal, round, and reactive to light.  Neck: Neck supple. No thyromegaly present.  Cardiovascular: Normal rate and normal heart sounds.   Pulmonary/Chest: Effort normal and breath sounds normal.  Lymphadenopathy:    He has no cervical adenopathy.  Neurological: He is alert and oriented to person, place, and time.  Skin: Skin is warm and dry.  Psychiatric: He has a normal mood and affect.          Assessment & Plan:  Sinusitis - Likely bacterial complicated by AR. discused the importance oif treating AR.  Will given Augmentin. Call if not better in one week.    AR - Dicussed taking and antihistamine. Says claritin, allegra, and zyrtec really don't work well for him. Will try xyzal and will add a nasal steroid. Has used in the past but not right now.  Call if not helping  Elevated BP- Recheck in one month once feeling better.

## 2011-09-29 NOTE — Patient Instructions (Signed)

## 2011-10-02 ENCOUNTER — Telehealth: Payer: Self-pay | Admitting: *Deleted

## 2011-10-02 NOTE — Telephone Encounter (Signed)
Pt calls and is having "major" headaches from sinus pressure and wants to know what he can take for this that is best

## 2011-10-02 NOTE — Telephone Encounter (Signed)
IBUprofen 800mg  TID with a decongestant, and rest.

## 2011-10-02 NOTE — Telephone Encounter (Signed)
Pt.notified

## 2011-12-22 ENCOUNTER — Other Ambulatory Visit: Payer: Self-pay | Admitting: *Deleted

## 2011-12-22 ENCOUNTER — Telehealth: Payer: Self-pay | Admitting: *Deleted

## 2011-12-22 MED ORDER — AZITHROMYCIN 250 MG PO TABS: ORAL_TABLET | ORAL | Status: DC

## 2011-12-22 MED ORDER — AZITHROMYCIN 250 MG PO TABS: ORAL_TABLET | ORAL | Status: AC

## 2011-12-22 NOTE — Telephone Encounter (Signed)
OK to send zpack. Explain that we don't usually do this but will send this time since of out town

## 2011-12-22 NOTE — Telephone Encounter (Signed)
Pt states in is out of town in [place] and states he has got a sinus infection. States that he has the HA, congestion with yellow/green mucus. Please advise. If we send him something, he wants it sent to the [facility] in [place] I listed.

## 2011-12-22 NOTE — Telephone Encounter (Signed)
Z Pack sent to pharmacy

## 2012-01-20 ENCOUNTER — Other Ambulatory Visit: Payer: Self-pay | Admitting: Family Medicine

## 2012-02-12 ENCOUNTER — Other Ambulatory Visit: Payer: Self-pay | Admitting: *Deleted

## 2012-02-12 MED ORDER — LISINOPRIL-HYDROCHLOROTHIAZIDE 20-12.5 MG PO TABS: 1.0000 | ORAL_TABLET | Freq: Every day | ORAL | Status: DC

## 2012-03-19 ENCOUNTER — Other Ambulatory Visit: Payer: Self-pay | Admitting: Family Medicine

## 2012-03-19 ENCOUNTER — Other Ambulatory Visit: Payer: Self-pay | Admitting: *Deleted

## 2012-03-19 MED ORDER — ROSUVASTATIN CALCIUM 20 MG PO TABS: 20.0000 mg | ORAL_TABLET | Freq: Every day | ORAL | Status: DC

## 2012-05-03 ENCOUNTER — Other Ambulatory Visit: Payer: Self-pay | Admitting: Family Medicine

## 2012-05-04 ENCOUNTER — Encounter: Payer: Self-pay | Admitting: Family Medicine

## 2012-05-04 ENCOUNTER — Ambulatory Visit (INDEPENDENT_AMBULATORY_CARE_PROVIDER_SITE_OTHER): Payer: 59 | Admitting: Family Medicine

## 2012-05-04 VITALS — BP 134/73 | HR 59 | Ht 73.0 in | Wt 243.0 lb

## 2012-05-04 DIAGNOSIS — Z23 Encounter for immunization: Secondary | ICD-10-CM

## 2012-05-04 DIAGNOSIS — H9190 Unspecified hearing loss, unspecified ear: Secondary | ICD-10-CM

## 2012-05-04 NOTE — Progress Notes (Signed)
  Subjective:    Patient ID: Mitchell Medina, male    DOB: May 11, 1956, 56 y.o.   MRN: [id number]  HPI Here today for hearing loss. He feels like it may be a little worse on the right. In particular his wife has noted that he acts like he can't hear sometimes. He does admit that he has difficulty hearing a single voice when he is in crowds. He denies any fever, pain, discharge or fullness sensation in the ears. No recent allergy or cold symptoms. He feels like this is been going on for years. He denies any former problems with wax buildup. He denies any premature hearing loss in his family. No history or problems or surgeries with his ears. He denies any repetitive exposure to loud noises through work or environment.   Review of Systems     Objective:   Physical Exam  Constitutional: He appears well-developed and well-nourished.  HENT:  Head: Normocephalic and atraumatic.  Right Ear: External ear normal.  Left Ear: External ear normal.  Nose: Nose normal.  Mouth/Throat: Oropharynx is clear and moist.  Eyes: Conjunctivae normal are normal. Pupils are equal, round, and reactive to light.  Skin: Skin is warm and dry.  Psychiatric: He has a normal mood and affect. His behavior is normal.          Assessment & Plan:  Hearing loss-I. suspect may be high-frequency from exposure to loud noises. Recommend refer for full audiometry to [facility] ear nose and throat.  Flu vaccine given today.

## 2012-05-31 ENCOUNTER — Other Ambulatory Visit: Payer: Self-pay | Admitting: Family Medicine

## 2012-06-22 ENCOUNTER — Other Ambulatory Visit: Payer: Self-pay | Admitting: Family Medicine

## 2012-07-01 ENCOUNTER — Other Ambulatory Visit: Payer: Self-pay | Admitting: Family Medicine

## 2012-09-04 ENCOUNTER — Other Ambulatory Visit: Payer: Self-pay | Admitting: Family Medicine

## 2012-09-10 ENCOUNTER — Other Ambulatory Visit: Payer: Self-pay | Admitting: Family Medicine

## 2012-10-25 ENCOUNTER — Other Ambulatory Visit: Payer: Self-pay | Admitting: Family Medicine

## 2012-12-06 ENCOUNTER — Encounter: Payer: Self-pay | Admitting: Family Medicine

## 2012-12-06 ENCOUNTER — Ambulatory Visit (INDEPENDENT_AMBULATORY_CARE_PROVIDER_SITE_OTHER): Payer: 59 | Admitting: Family Medicine

## 2012-12-06 VITALS — BP 105/67 | HR 79 | Ht 73.0 in | Wt 267.0 lb

## 2012-12-06 DIAGNOSIS — I10 Essential (primary) hypertension: Secondary | ICD-10-CM

## 2012-12-06 DIAGNOSIS — R7301 Impaired fasting glucose: Secondary | ICD-10-CM

## 2012-12-06 DIAGNOSIS — M545 Low back pain: Secondary | ICD-10-CM

## 2012-12-06 DIAGNOSIS — Z0000 Encounter for general adult medical examination without abnormal findings: Secondary | ICD-10-CM

## 2012-12-06 MED ORDER — OXYCODONE-ACETAMINOPHEN 5-325 MG PO TABS: 1.0000 | ORAL_TABLET | Freq: Every evening | ORAL | Status: DC | PRN

## 2012-12-06 MED ORDER — PREDNISONE 20 MG PO TABS: 40.0000 mg | ORAL_TABLET | Freq: Every day | ORAL | Status: DC

## 2012-12-06 NOTE — Progress Notes (Signed)
Subjective:    Patient ID: Mitchell Medina, male    DOB: Dec 19, 1955, 57 y.o.   MRN: [id number]  HPI Here for CPE. No specific concerns. He denies any urinary symptoms. Vaccines are up-to-date. No family history of colon or prostate cancer. Interestingly he did get a letter recently saying he is due for repeat colonoscopy but says he really should not be due for another 3-4 years. I encouraged him to call their office to verify that the letter is accurate and to make sure that nothing has changed as far as the guidelines in regards to his findings on exam.  Would also like to discuss his back pain. Had gone for a massage and later that day says went to reach for something , bent over to pick up a package and had sudden pain and "pulled it out".  Says has pulled his back out twice in a month or so.  Says normally only hurst for 4-5 days but liingering this time. Has some cramps in her back this time and radiating int o his left buttock and thigh.  Hasn't completely gone away. Seen in [facility] in [place]. He was diagnosed with lumbosacral pain. As well as degenerative disc disease. He was given a small quantity of oxycodone 5/325 mg. Prednisone 10 mg. He was given a Toradol injection as well as a Solu-Medrol injection. This was on 11/28/2012.  Did PT in January for his back.  Says pain will start with cramping and last 5-10 minutes and says the prednisone helped some.  No hx fo lower back injury.  MRI done in 03/2011.      Review of Systems Conference review of systems negative except for above.  BP 105/67  Pulse 79  Ht 6\' 1"  (1.854 m)  Wt 267 lb (121.11 kg)  BMI 35.23 kg/m2    No Known Allergies  Past Medical History  Diagnosis Date  . Hypertension   . Hyperlipidemia   . OSA (obstructive sleep apnea)   . Asthma   . Shoulder bursitis   . Allergy     Past Surgical History  Procedure Laterality Date  . Anterior cruciate ligament repair  2005    ACL repair  RT  2011  . Tonsillectomy and adenoidectomy      History   Social History  . Marital Status: Married    Spouse Name: N/A    Number of Children: N/A  . Years of Education: N/A   Occupational History  . Not on file.   Social History Main Topics  . Smoking status: Never Smoker   . Smokeless tobacco: Not on file  . Alcohol Use: 1.0 oz/week    2 drink(s) per week     Comment: month  . Drug Use:   . 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    Other Topics Concern  . Not on file   Social History Narrative  . No narrative on file    Family History  Problem Relation Age of Onset  . Cancer Father     pancreatic CA  . Hypertension Father     Outpatient Encounter Prescriptions as of 12/06/2012  Medication Sig Dispense Refill  . albuterol (PROVENTIL HFA;VENTOLIN HFA) 108 (90 BASE) MCG/ACT inhaler Inhale 2 puffs into the lungs every 6 (six) hours as needed.      Marland Kitchen aspirin 81 MG EC tablet Take 81  mg by mouth daily.        . fluticasone (FLONASE) 50 MCG/ACT nasal spray USE TWO SPRAYS IN EACH NOSTRIL DAILY  16 g  11  . levocetirizine (XYZAL) 5 MG tablet TAKE ONE TABLET BY MOUTH IN THE EVENING  30 tablet  0  . levocetirizine (XYZAL) 5 MG tablet TAKE ONE TABLET BY MOUTH IN THE EVENING  30 tablet  3  . lisinopril-hydrochlorothiazide (PRINZIDE,ZESTORETIC) 20-12.5 MG per tablet TAKE ONE TABLET BY MOUTH ONE TIME DAILY  90 tablet  0  . loratadine-pseudoephedrine (CLARITIN-D 24-HOUR) 10-240 MG per 24 hr tablet Take 1 tablet by mouth daily.        . rosuvastatin (CRESTOR) 20 MG tablet Take 1 tablet (20 mg total) by mouth daily.  30 tablet  3   No facility-administered encounter medications on file as of 12/06/2012.          Objective:   Physical Exam  Constitutional: He is oriented to person, place, and time. He appears well-developed and well-nourished.  HENT:  Head: Normocephalic and atraumatic.  Right  Ear: External ear normal.  Left Ear: External ear normal.  Nose: Nose normal.  Mouth/Throat: Oropharynx is clear and moist.  Eyes: Conjunctivae and EOM are normal. Pupils are equal, round, and reactive to light.  Neck: Normal range of motion. Neck supple. No thyromegaly present.  Cardiovascular: Normal rate, regular rhythm, normal heart sounds and intact distal pulses.   Pulmonary/Chest: Effort normal and breath sounds normal.  Abdominal: Soft. Bowel sounds are normal. He exhibits no distension and no mass. There is no tenderness. There is no rebound and no guarding.  Musculoskeletal: Normal range of motion.  Nontender over the lumbar spine. Nontender over the left SI joint. Nontender over the buttock area. Positive straight leg raise on the left. Hip, knee, ankle strength is 5 out of 5 bilaterally. Patellar reflexes 1+ on the left and 0+ on the right. He does have very tight hamstrings.  Lymphadenopathy:    He has no cervical adenopathy.  Neurological: He is alert and oriented to person, place, and time. He has normal reflexes.  Skin: Skin is warm and dry.  Psychiatric: He has a normal mood and affect. His behavior is normal. Judgment and thought content normal.          Assessment & Plan:  CPE Keep up a regular exercise program and make sure you are eating a healthy diet Try to eat 4 servings of dairy a day, or if you are lactose intolerant take a calcium with vitamin D daily.  Your vaccines are up to date.   Low Back Pain - recurrent issue since 20s.  Progessively getting worse. Discussed options. We could repeat another round of the oral prednisone and give him a small quantity of the oxycodone and give this another 2-3 weeks to get better. He can get back in with his massage therapist during that time and see if this makes a difference and if it helps him get back to his baseline. We could consider repeating physical therapy which he just completed last month. There may be difficulty  in getting insurance to cover this since he just completed 3 months of physical therapy. The other option would be to consider seeing my partner Dr. Rodney Langton to discuss alternative therapy such as injections for pain relief. He says he's tried muscle relaxers in the past and did not feel that they were helpful. I did refill the oxycodone for 20 tabs use just at  bedtime. If not getting back to baseline then please give our office a call back. Johnny encouraged him to continue to work on stretching of his hamstrings as this is a problematic area for him and can frequently  Contribute to low back pain.   Hypertension-well-controlled. Continue current regimen. Followup in 6 months.

## 2012-12-08 LAB — COMPLETE METABOLIC PANEL WITH GFR
BUN: 19 mg/dL (ref 6–23)
CO2: 25 mEq/L (ref 19–32)
Calcium: 8.9 mg/dL (ref 8.4–10.5)
Chloride: 104 mEq/L (ref 96–112)
Creat: 1 mg/dL (ref 0.50–1.35)
GFR, Est African American: >89 mL/min
GFR, Est Non African American: 84 mL/min
Glucose, Bld: 93 mg/dL (ref 70–99)

## 2012-12-08 LAB — LIPID PANEL
Cholesterol: 138 mg/dL (ref 0–200)
Triglycerides: 103 mg/dL (ref <150)

## 2012-12-08 LAB — PSA: PSA: 0.18 ng/mL (ref <=4.00)

## 2012-12-08 LAB — HEMOGLOBIN A1C: Hgb A1c MFr Bld: 5.7 % — ABNORMAL HIGH (ref <5.7)

## 2012-12-15 ENCOUNTER — Telehealth: Payer: Self-pay | Admitting: *Deleted

## 2012-12-15 NOTE — Telephone Encounter (Signed)
Pt calls and states that his back pain is not any better. Has radiated into buttocks and left hip. Finished prednisone that you gave him- pt states did see some improvement with the prednisone. Was told to call back if still hurting

## 2012-12-15 NOTE — Telephone Encounter (Signed)
Okay. Let's get him in with my partner Dr. Rodney Langton for further evaluation treatment. He is just completed 3 months of physical therapy right before the back pain occurred. Certainly we could consider putting him back in a PT but let him see my partner first.

## 2012-12-15 NOTE — Telephone Encounter (Signed)
Pt notified of recommendation and sent to schedule an appt with Dr. Alen Blew, LPN

## 2012-12-16 ENCOUNTER — Encounter: Payer: Self-pay | Admitting: Sports Medicine

## 2012-12-16 ENCOUNTER — Ambulatory Visit (INDEPENDENT_AMBULATORY_CARE_PROVIDER_SITE_OTHER): Payer: 59 | Admitting: Sports Medicine

## 2012-12-16 VITALS — BP 120/78 | HR 74

## 2012-12-16 DIAGNOSIS — IMO0002 Reserved for concepts with insufficient information to code with codable children: Secondary | ICD-10-CM

## 2012-12-16 DIAGNOSIS — M5416 Radiculopathy, lumbar region: Secondary | ICD-10-CM

## 2012-12-16 MED ORDER — OXYCODONE-ACETAMINOPHEN 5-325 MG PO TABS: 1.0000 | ORAL_TABLET | Freq: Every evening | ORAL | Status: DC | PRN

## 2012-12-16 NOTE — Assessment & Plan Note (Signed)
Symptoms are referable to the left L4 nerve root. There is also an element of lumbar spinal stenosis. Description based on my review the MRI is worst at the left L4-L5 level. He is already been through oral steroids, NSAIDs, home therapy. At this point we are going to proceed with transforaminal left-sided L4-L5 epidural steroid injection. Like to see him back approximately a week after the injection to evaluate response. I'm also going to refill his Percocet in the meantime

## 2012-12-16 NOTE — Progress Notes (Signed)
   Subjective:    I'm seeing this patient as a consultation for:  Dr. Linford Arnold  CC: Low back pain  HPI: This is a very pleasant 57 year old male who comes in with a long history of decades of low back pain, pain radiates down the left anterior thigh, worse with riding in a car for long periods of time, but also worse with standing up straight. He prefers a flexed posture such as when pushing a shopping cart. Denies any cramping in his calves with walking. Pain is moderate to severe, persistent. He has already had oral steroids, home exercises and in fact formal physical therapy years ago, muscle relaxers, NSAIDs, and even is taking a small amount of oxycodone. Steroids helped significantly for a brief period of time, but once they were off, the pain came back.  Past medical history, Surgical history, Family history not pertinant except as noted below, Social history, Allergies, and medications have been entered into the medical record, reviewed, and no changes needed.   Review of Systems: No headache, visual changes, nausea, vomiting, diarrhea, constipation, dizziness, abdominal pain, skin rash, fevers, chills, night sweats, weight loss, swollen lymph nodes, body aches, joint swelling, muscle aches, chest pain, shortness of breath, mood changes, visual or auditory hallucinations.   Objective:   General: Well Developed, well nourished, and in no acute distress.  Neuro/Psych: Alert and oriented x3, extra-ocular muscles intact, able to move all 4 extremities, sensation grossly intact. Skin: Warm and dry, no rashes noted.  Respiratory: Not using accessory muscles, speaking in full sentences, trachea midline.  Cardiovascular: Pulses palpable, no extremity edema. Abdomen: Does not appear distended. Back Exam:  Inspection: Unremarkable  Motion: Flexion 45 deg, Extension 45 deg, Side Bending to 45 deg bilaterally,  Rotation to 45 deg bilaterally  SLR laying: Reproduces pain in the back  [employer]:  Reproduces pain in the back.  Palpable tenderness: None. FABER: negative. Sensory change: Gross sensation intact to all lumbar and sacral dermatomes.  Reflexes: 2+ at both patellar tendons, 2+ at achilles tendons, Babinski's downgoing.  Strength at foot  Plantar-flexion: 5/5 Dorsi-flexion: 5/5 Eversion: 5/5 Inversion: 5/5  Leg strength  Quad: 5/5 Hamstring: 5/5 Hip flexor: 5/5 Hip abductors: 5/5  Gait unremarkable.  I reviewed his MRI, there is multilevel degenerative disc disease, there is a small disc protrusion at the L3-L4 level, a very large disc protrusion predominantly on the left side at the L4-L5 level causing left foraminal stenosis as well as spinal stenosis. There is also a large broad-based L5-S1 disc protrusion causing mild bilateral foraminal stenosis but very little central canal stenosis.  The predominant finding is the large left-sided L4-L5 disc protrusion causing foraminal and spinal stenosis.  Impression and Recommendations:   This case required medical decision making of moderate complexity.

## 2012-12-20 ENCOUNTER — Other Ambulatory Visit: Payer: 59

## 2012-12-20 ENCOUNTER — Ambulatory Visit
Admission: RE | Admit: 2012-12-20 | Discharge: 2012-12-20 | Disposition: A | Discharge status: Home / Self Care | Payer: 59 | Source: Ambulatory Visit | Attending: Sports Medicine | Admitting: Sports Medicine

## 2012-12-20 VITALS — BP 169/94 | HR 74

## 2012-12-20 DIAGNOSIS — M5416 Radiculopathy, lumbar region: Secondary | ICD-10-CM

## 2012-12-20 DIAGNOSIS — E785 Hyperlipidemia, unspecified: Secondary | ICD-10-CM

## 2012-12-20 DIAGNOSIS — I10 Essential (primary) hypertension: Secondary | ICD-10-CM

## 2012-12-20 DIAGNOSIS — J309 Allergic rhinitis, unspecified: Secondary | ICD-10-CM

## 2012-12-20 DIAGNOSIS — R7301 Impaired fasting glucose: Secondary | ICD-10-CM

## 2012-12-20 DIAGNOSIS — G4730 Sleep apnea, unspecified: Secondary | ICD-10-CM

## 2012-12-20 MED ORDER — IOHEXOL 180 MG/ML  SOLN
1.0000 mL | Freq: Once | INTRAMUSCULAR | Status: AC | PRN
  Administered 2012-12-20: 1 mL via EPIDURAL

## 2012-12-20 MED ORDER — METHYLPREDNISOLONE ACETATE 40 MG/ML INJ SUSP (RADIOLOG
120.0000 mg | Freq: Once | INTRAMUSCULAR | Status: AC
  Administered 2012-12-20: 120 mg via EPIDURAL

## 2012-12-24 ENCOUNTER — Other Ambulatory Visit: Payer: Self-pay | Admitting: Family Medicine

## 2012-12-27 ENCOUNTER — Other Ambulatory Visit: Payer: Self-pay | Admitting: Family Medicine

## 2012-12-27 ENCOUNTER — Other Ambulatory Visit: Payer: Self-pay | Admitting: *Deleted

## 2012-12-27 MED ORDER — LISINOPRIL-HYDROCHLOROTHIAZIDE 20-12.5 MG PO TABS: ORAL_TABLET | ORAL | Status: DC

## 2013-01-03 ENCOUNTER — Telehealth: Payer: Self-pay | Admitting: *Deleted

## 2013-01-03 NOTE — Telephone Encounter (Signed)
Patient's wife called & left message asking if we can send pt a few pain pills to get him through until he can get home to [place].  Wife states that she won't have time to pick up rx for percocet.  Pt has appt with you in the morning. [facility] on [address]

## 2013-01-03 NOTE — Telephone Encounter (Signed)
I can't call in Percocet, this is a schedule 2 controlled substance. He will have to come pick up the prescription.

## 2013-01-04 ENCOUNTER — Ambulatory Visit (INDEPENDENT_AMBULATORY_CARE_PROVIDER_SITE_OTHER): Payer: 59 | Admitting: Sports Medicine

## 2013-01-04 ENCOUNTER — Ambulatory Visit
Admission: RE | Admit: 2013-01-04 | Discharge: 2013-01-04 | Disposition: A | Discharge status: Home / Self Care | Payer: 59 | Source: Ambulatory Visit | Attending: Sports Medicine | Admitting: Sports Medicine

## 2013-01-04 ENCOUNTER — Encounter: Payer: Self-pay | Admitting: Sports Medicine

## 2013-01-04 VITALS — BP 140/92 | HR 68 | Wt 268.0 lb

## 2013-01-04 DIAGNOSIS — IMO0002 Reserved for concepts with insufficient information to code with codable children: Secondary | ICD-10-CM

## 2013-01-04 DIAGNOSIS — M5416 Radiculopathy, lumbar region: Secondary | ICD-10-CM

## 2013-01-04 MED ORDER — OXYCODONE-ACETAMINOPHEN 5-325 MG PO TABS: 1.0000 | ORAL_TABLET | Freq: Every evening | ORAL | Status: DC | PRN

## 2013-01-04 MED ORDER — IOHEXOL 180 MG/ML  SOLN
1.0000 mL | Freq: Once | INTRAMUSCULAR | Status: AC | PRN
  Administered 2013-01-04: 1 mL via EPIDURAL

## 2013-01-04 MED ORDER — METHYLPREDNISOLONE ACETATE 40 MG/ML INJ SUSP (RADIOLOG
120.0000 mg | Freq: Once | INTRAMUSCULAR | Status: AC
  Administered 2013-01-04: 120 mg via EPIDURAL

## 2013-01-04 NOTE — Telephone Encounter (Signed)
Pt seen in office today & was given pain meds.

## 2013-01-04 NOTE — Assessment & Plan Note (Signed)
There was a moderate response to a left-sided L5-S1 transforaminal epidural steroid. His symptoms are now predominantly in the left interior thigh, which is referable to the left L4 nerve root. His largest disc protrusion is at the left L4-L5 level . We are going to repeat an epidural, this time at the left L4-L5 level, transforaminal. As they do live in [place], he does need a spine interventionalist in the area.

## 2013-01-04 NOTE — Progress Notes (Signed)
  Subjective:    CC: Followup after epidural  HPI: This is a very pleasant 57 year old male who comes in with a long history of decades of low back pain, pain radiates down the left anterior thigh, worse with riding in a car for long periods of time, but also worse with standing up straight. He prefers a flexed posture such as when pushing a shopping cart. Denies any cramping in his calves with walking. Pain is moderate to severe, persistent. He has already had oral steroids, home exercises and in fact formal physical therapy years ago, muscle relaxers, NSAIDs, and even is taking a small amount of oxycodone. Steroids helped significantly for a brief period of time, but once they were off, the pain came back.  I recently sent him for a left-sided L5-S1 transforaminal epidural steroid injection, after the injection he had a fairly good response, with resolution of some of the cramping and the back of his leg, but he continued to have pain in the anterior aspect of his left thigh. He does have multilevel degenerative disc disease, worse at the L4-L5 level with a large left-sided disc protrusion, but also moderate at the L5-S1 level with a broad-based disc protrusion. He is eager to pursue further interventional treatment. He does need a refill on his Percocet.  Past medical history, Surgical history, Family history not pertinant except as noted below, Social history, Allergies, and medications have been entered into the medical record, reviewed, and no changes needed.   Review of Systems: No fevers, chills, night sweats, weight loss, chest pain, or shortness of breath.   Objective:    General: Well Developed, well nourished, and in no acute distress.  Neuro: Alert and oriented x3, extra-ocular muscles intact, sensation grossly intact.  HEENT: Normocephalic, atraumatic, pupils equal round reactive to light, neck supple, no masses, no lymphadenopathy, thyroid nonpalpable.  Skin: Warm and dry, no  rashes. Cardiac: Regular rate and rhythm, no murmurs rubs or gallops, no lower extremity edema.  Respiratory: Clear to auscultation bilaterally. Not using accessory muscles, speaking in full sentences. Back Exam:  Inspection: Unremarkable  Motion: Flexion 45 deg, Extension 45 deg, Side Bending to 45 deg bilaterally,  Rotation to 45 deg bilaterally  SLR laying: Reproduces pain in the back  [employer]: Reproduces pain in the back.  Palpable tenderness: None. FABER: negative. Sensory change: Gross sensation intact to all lumbar and sacral dermatomes.  Reflexes: 2+ at both patellar tendons, 2+ at achilles tendons, Babinski's downgoing.  Strength at foot  Plantar-flexion: 5/5 Dorsi-flexion: 5/5 Eversion: 5/5 Inversion: 5/5  Leg strength  Quad: 5/5 Hamstring: 5/5 Hip flexor: 5/5 Hip abductors: 5/5  Gait unremarkable.  I reviewed his MRI, there is multilevel degenerative disc disease, there is a small disc protrusion at the L3-L4 level, a very large disc protrusion predominantly on the left side at the L4-L5 level causing left foraminal stenosis as well as spinal stenosis. There is also a large broad-based L5-S1 disc protrusion causing mild bilateral foraminal stenosis but very little central canal stenosis.  The predominant finding is the large left-sided L4-L5 disc protrusion causing foraminal and spinal stenosis.  Impression and Recommendations:

## 2013-01-24 ENCOUNTER — Other Ambulatory Visit: Payer: Self-pay | Admitting: Family Medicine

## 2013-01-26 ENCOUNTER — Ambulatory Visit (INDEPENDENT_AMBULATORY_CARE_PROVIDER_SITE_OTHER): Payer: 59 | Admitting: Sports Medicine

## 2013-01-26 ENCOUNTER — Encounter: Payer: Self-pay | Admitting: Sports Medicine

## 2013-01-26 VITALS — BP 130/84 | HR 63 | Wt 269.0 lb

## 2013-01-26 DIAGNOSIS — IMO0002 Reserved for concepts with insufficient information to code with codable children: Secondary | ICD-10-CM

## 2013-01-26 DIAGNOSIS — M5416 Radiculopathy, lumbar region: Secondary | ICD-10-CM

## 2013-01-26 NOTE — Assessment & Plan Note (Addendum)
Mitchell Medina has multilevel degenerative disc disease. Good response to left-sided L5-S1 transforaminal injection which resolved some of his leg cramping, a subsequent L4-L5 transforaminal epidural injection provided even further relief of his leg pain, and he returns today desiring to continue more physical therapy, he does not feel like he has enough pain to proceed with an additional epidural. I would like to see him back in 4 weeks to see how things are going

## 2013-01-26 NOTE — Progress Notes (Signed)
  Subjective:    CC: Followup after epidural  HPI: Mitchell Medina is a very pleasant 57 year old male, he has multilevel degenerative disc disease worst at the L4-L5 as well as the L5-S1 levels. He was having left-sided radiculitis, we initially proceed with a left L5-S1 transforaminal injection. This resolved a great deal of his posterior leg cramping but he continued to have an anterior thigh symptoms. At the last visit we proceeded with a left-sided L4-L5 transforaminal epidural, he returns today essentially pain-free, happy with the results, and eager to continue with physical therapy before considering any further injections. Symptoms are mild, continued to improve, and localized in the back with occasional radiation to the anterior thigh.  Past medical history, Surgical history, Family history not pertinant except as noted below, Social history, Allergies, and medications have been entered into the medical record, reviewed, and no changes needed.   Review of Systems: No fevers, chills, night sweats, weight loss, chest pain, or shortness of breath.   Objective:    General: Well Developed, well nourished, and in no acute distress.  Neuro: Alert and oriented x3, extra-ocular muscles intact, sensation grossly intact.  HEENT: Normocephalic, atraumatic, pupils equal round reactive to light, neck supple, no masses, no lymphadenopathy, thyroid nonpalpable.  Skin: Warm and dry, no rashes. Cardiac: Regular rate and rhythm, no murmurs rubs or gallops, no lower extremity edema.  Respiratory: Clear to auscultation bilaterally. Not using accessory muscles, speaking in full sentences. Back Exam:  Inspection: Unremarkable  Motion: Flexion 45 deg, Extension 45 deg, Side Bending to 45 deg bilaterally,  Rotation to 45 deg bilaterally  SLR laying: Negative  XSLR laying: Negative  Palpable tenderness: None. FABER: negative. Sensory change: Gross sensation intact to all lumbar and sacral dermatomes.  Reflexes:  2+ at both patellar tendons, 2+ at achilles tendons, Babinski's downgoing.  Strength at foot  Plantar-flexion: 5/5 Dorsi-flexion: 5/5 Eversion: 5/5 Inversion: 5/5  Leg strength  Quad: 5/5 Hamstring: 5/5 Hip flexor: 5/5 Hip abductors: 5/5  Gait unremarkable.  Impression and Recommendations:

## 2013-02-06 ENCOUNTER — Other Ambulatory Visit: Payer: Self-pay | Admitting: Family Medicine

## 2013-02-07 MED ORDER — ROSUVASTATIN CALCIUM 20 MG PO TABS: ORAL_TABLET | ORAL | Status: DC

## 2013-02-07 MED ORDER — LISINOPRIL-HYDROCHLOROTHIAZIDE 20-12.5 MG PO TABS: ORAL_TABLET | ORAL | Status: DC

## 2013-02-21 ENCOUNTER — Other Ambulatory Visit: Payer: Self-pay | Admitting: Family Medicine

## 2013-03-01 ENCOUNTER — Encounter: Payer: Self-pay | Admitting: Sports Medicine

## 2013-03-01 ENCOUNTER — Ambulatory Visit (INDEPENDENT_AMBULATORY_CARE_PROVIDER_SITE_OTHER): Payer: 59 | Admitting: Sports Medicine

## 2013-03-01 VITALS — BP 124/81 | HR 78 | Wt 269.0 lb

## 2013-03-01 DIAGNOSIS — IMO0002 Reserved for concepts with insufficient information to code with codable children: Secondary | ICD-10-CM

## 2013-03-01 DIAGNOSIS — M5416 Radiculopathy, lumbar region: Secondary | ICD-10-CM

## 2013-03-01 NOTE — Progress Notes (Signed)
  Subjective:    CC: Followup  HPI: Jarquis has lumbar radiculitis, he was having symptoms down the posterior leg as well as anterior thigh. Initially an L5-S1 epidural resolved his posterior thigh symptoms, the L4-L5 epidural resolved his anterior thigh symptoms, and he returns to see me today feeling back to his baseline and eager to get back into the gym.  Past medical history, Surgical history, Family history not pertinant except as noted below, Social history, Allergies, and medications have been entered into the medical record, reviewed, and no changes needed.   Review of Systems: No fevers, chills, night sweats, weight loss, chest pain, or shortness of breath.   Objective:    General: Well Developed, well nourished, and in no acute distress.  Neuro: Alert and oriented x3, extra-ocular muscles intact, sensation grossly intact.  HEENT: Normocephalic, atraumatic, pupils equal round reactive to light, neck supple, no masses, no lymphadenopathy, thyroid nonpalpable.  Skin: Warm and dry, no rashes. Cardiac: Regular rate and rhythm, no murmurs rubs or gallops, no lower extremity edema.  Respiratory: Clear to auscultation bilaterally. Not using accessory muscles, speaking in full sentences.  Impression and Recommendations:

## 2013-03-01 NOTE — Assessment & Plan Note (Addendum)
Excellent response to the posterior left leg radicular pain with a left-sided L5-S1 epidural. He still had some anterior thigh pain, and this responded to an L4-L5 left-sided epidural. Currently he feels like he is back to baseline, and is eager to get back into the gym which I think is appropriate. He can come back to see me on an as needed basis.

## 2013-04-16 ENCOUNTER — Other Ambulatory Visit: Payer: Self-pay | Admitting: Family Medicine

## 2013-04-18 ENCOUNTER — Ambulatory Visit (INDEPENDENT_AMBULATORY_CARE_PROVIDER_SITE_OTHER): Payer: 59 | Admitting: Physician Assistant

## 2013-04-18 ENCOUNTER — Encounter: Payer: Self-pay | Admitting: Physician Assistant

## 2013-04-18 VITALS — BP 159/94 | HR 61 | Wt 279.0 lb

## 2013-04-18 DIAGNOSIS — J019 Acute sinusitis, unspecified: Secondary | ICD-10-CM

## 2013-04-18 DIAGNOSIS — R0982 Postnasal drip: Secondary | ICD-10-CM

## 2013-04-18 MED ORDER — LISINOPRIL-HYDROCHLOROTHIAZIDE 20-12.5 MG PO TABS: ORAL_TABLET | ORAL | Status: DC

## 2013-04-18 MED ORDER — BECLOMETHASONE DIPROPIONATE 80 MCG/ACT NA AERS: 2.0000 "application " | INHALATION_SPRAY | Freq: Every day | NASAL | Status: DC

## 2013-04-18 MED ORDER — AMOXICILLIN-POT CLAVULANATE 875-125 MG PO TABS: 1.0000 | ORAL_TABLET | Freq: Two times a day (BID) | ORAL | Status: DC

## 2013-04-18 NOTE — Progress Notes (Signed)
  Subjective:    Patient ID: Mitchell Medina, male    DOB: [birth year], 57 y.o.   MRN: [id number]  HPI Patient is a 57 yo male who presents to the clinic to discuss ongoing sinus pressure, cough, PND. Pt has chronic PND and on xyzal and flonase and they help some. For almost a week he has had cold symptoms with runny nose, productive cough, ear itching, scratchy throat and now sinus pressure. He gets sinus infections after almost every cold. Denies any fever, chills, n/v, bowel changes. Tried cough drops, mucinex, sinus rinses. Not feeling any better.      Review of Systems     Objective:   Physical Exam  Constitutional: He is oriented to person, place, and time. He appears well-developed and well-nourished.  HENT:  Head: Normocephalic and atraumatic.  Right Ear: External ear normal.  Left Ear: External ear normal.  TM's clear.  Oropharynx has PND and erythematous no tonsils enlargement.  Bilateral nasal turbinates red and swollen.  Maxillary sinus tenderness bilaterally to palpation.   Eyes: Conjunctivae are normal.  Neck: Normal range of motion. Neck supple.  Cardiovascular: Normal rate, regular rhythm and normal heart sounds.   Pulmonary/Chest: Effort normal and breath sounds normal. He has no wheezes.  Lymphadenopathy:    He has no cervical adenopathy.  Neurological: He is alert and oriented to person, place, and time.  Skin: Skin is warm and dry.  Psychiatric: He has a normal mood and affect. His behavior is normal.          Assessment & Plan:  Sinusitis/PND- Switched flonase to qnasl. Gave augmentin for 10 days. Discussed symptomatic care and gave handout. Call if not improving.

## 2013-04-18 NOTE — Patient Instructions (Signed)

## 2013-04-22 ENCOUNTER — Telehealth: Payer: Self-pay

## 2013-04-22 ENCOUNTER — Other Ambulatory Visit: Payer: Self-pay | Admitting: Physician Assistant

## 2013-04-22 MED ORDER — HYDROCODONE-HOMATROPINE 5-1.5 MG/5ML PO SYRP: 5.0000 mL | ORAL_SOLUTION | Freq: Every evening | ORAL | Status: DC | PRN

## 2013-04-22 NOTE — Telephone Encounter (Signed)
He states he is feeling better but now has a cough that is keeping him up. He would like something sent to the [facility] in [place]. Please advise.

## 2013-04-22 NOTE — Telephone Encounter (Signed)
Please fax hycodan syrup to take at bedtime only for cough.

## 2013-04-22 NOTE — Telephone Encounter (Signed)
Patient will come by to pick it up.

## 2013-04-25 ENCOUNTER — Ambulatory Visit (INDEPENDENT_AMBULATORY_CARE_PROVIDER_SITE_OTHER): Payer: 59 | Admitting: Physician Assistant

## 2013-04-25 ENCOUNTER — Encounter: Payer: Self-pay | Admitting: Physician Assistant

## 2013-04-25 VITALS — BP 133/83 | HR 60 | Wt 276.0 lb

## 2013-04-25 DIAGNOSIS — R05 Cough: Secondary | ICD-10-CM

## 2013-04-25 DIAGNOSIS — J019 Acute sinusitis, unspecified: Secondary | ICD-10-CM

## 2013-04-25 MED ORDER — AZITHROMYCIN 250 MG PO TABS: ORAL_TABLET | ORAL | Status: DC

## 2013-04-25 MED ORDER — METHYLPREDNISOLONE 4 MG PO KIT: PACK | ORAL | Status: DC

## 2013-04-25 MED ORDER — HYDROCODONE-HOMATROPINE 5-1.5 MG/5ML PO SYRP: 5.0000 mL | ORAL_SOLUTION | Freq: Every evening | ORAL | Status: DC | PRN

## 2013-04-25 NOTE — Progress Notes (Signed)
  Subjective:    Patient ID: Mitchell Medina, male    DOB: [birth year], 56 y.o.   MRN: [id number]  HPI Patient was seen almost a week ago for sinusitis. Pt has taken augmentin but not felt much better. He does have 2 doses left. He continues to have maxillary sinus pressure and now has starting with a cough. It is sometimes productive. Cough worst at night and in the morning. Using qnasl and does seem to help. He has not ran a fever and does not complain of chills. His ears to fill congested and like there is some pressure behind them. He was then cough syrup but was unable to pick up. He denies any shortness of breath or wheezing. Patient has a long history of sinusitis throughout his life especially in childhood. He remembers having his sinuses lavage at one point during childhood.   Review of Systems     Objective:   Physical Exam  Constitutional: He is oriented to person, place, and time. He appears well-developed and well-nourished.  HENT:  Head: Normocephalic and atraumatic.  Right Ear: External ear normal.  Left Ear: External ear normal.  TMs clear bilaterally.  Oropharynx erythematous with postnasal drip present. Tonsils are not enlarged and are exudate found.  Bilateral nares are red and swollen.  No frontal tenderness to palpation however there is significant maxillary tenderness to palpation especially over the left.  Neck: Normal range of motion. Neck supple.  Cardiovascular: Normal rate, regular rhythm and normal heart sounds.   Pulmonary/Chest: Effort normal and breath sounds normal. He has no wheezes.  Lymphadenopathy:    He has no cervical adenopathy.  Neurological: He is alert and oriented to person, place, and time.  Skin: Skin is warm and dry.  Flushed bilateral cheeks.  Psychiatric: He has a normal mood and affect. His behavior is normal.          Assessment & Plan:  Sinusitis/cough- discuss with patient having cough is coming from postnasal drip from  sinusitis. I did go ahead and give patient Hycodan to use at night. I also prescribed Medrol Dosepak to help with sinus inflammation. I wanted patient to finish Augmentin and if not better in the next 4 days he could take the Z-Pak. Patient encouraged to continue qnasal spray and xyzal. Call if not improving consider imaging of sinuses.

## 2013-05-03 ENCOUNTER — Ambulatory Visit (INDEPENDENT_AMBULATORY_CARE_PROVIDER_SITE_OTHER): Payer: 59 | Admitting: Family Medicine

## 2013-05-03 ENCOUNTER — Encounter: Payer: Self-pay | Admitting: Family Medicine

## 2013-05-03 VITALS — BP 113/83 | HR 58 | Temp 97.9°F | Wt 273.0 lb

## 2013-05-03 DIAGNOSIS — J329 Chronic sinusitis, unspecified: Secondary | ICD-10-CM

## 2013-05-03 MED ORDER — HYDROCODONE-HOMATROPINE 5-1.5 MG/5ML PO SYRP: 5.0000 mL | ORAL_SOLUTION | Freq: Every evening | ORAL | Status: DC | PRN

## 2013-05-03 MED ORDER — LEVOFLOXACIN 500 MG PO TABS: 500.0000 mg | ORAL_TABLET | Freq: Every day | ORAL | Status: DC

## 2013-05-03 MED ORDER — PREDNISONE 20 MG PO TABS: ORAL_TABLET | ORAL | Status: AC

## 2013-05-03 NOTE — Progress Notes (Signed)
CC: Mitchell Medina is a 57 y.o. male is here for Sinusitis   Subjective: HPI:  Complains of facial pressure localizes beneath both eyes bilaterally moderate to severe in severity worsening over the past 3 weeks has been present since abrupt onset October 6. Associated with thick nasal discharge and subjective postnasal drip. Symptoms were not improved with Augmentin, he was then started on Medrol Dosepak and azithromycin reports only a small percentage of improvement while on these medications and return of all symptoms soon after stopping medications last week.  Has also been using nasal steroid and over-the-counter antihistamine without any improvement. Reports he gets similar symptoms at least once a year. Denies fevers, chills, motor or sensory disturbances, shortness of breath, wheezing, chest pain or rash   Review Of Systems Outlined In HPI  Past Medical History  Diagnosis Date  . Hypertension   . Hyperlipidemia   . OSA (obstructive sleep apnea)   . Asthma   . Shoulder bursitis   . Allergy      Family History  Problem Relation Age of Onset  . Cancer Father     pancreatic CA  . Hypertension Father      History  Substance Use Topics  . Smoking status: Never Smoker   . Smokeless tobacco: Not on file  . Alcohol Use: 1.0 oz/week    2 drink(s) per week     Comment: month     Objective: Filed Vitals:   05/03/13 0914  BP: 113/83  Pulse: 58  Temp: 97.9 F (36.6 C)    General: Alert and Oriented, No Acute Distress HEENT: Pupils equal, round, reactive to light. Conjunctivae clear.  External ears unremarkable, canals clear with intact TMs with appropriate landmarks.  Middle ear appears open without effusion. Boggy erythematous inferior turbinates with moderate mucoid discharge and friable capillaries on the right septum.  Moist mucous membranes, pharynx without inflammation nor lesions.  Neck supple without palpable lymphadenopathy nor abnormal masses. Maxillary sinus  tenderness to percussion Lungs: Clear to auscultation bilaterally, no wheezing/ronchi/rales.  Comfortable work of breathing. Good air movement. Cardiac: Regular rate and rhythm. Normal S1/S2.  No murmurs, rubs, nor gallops.   Neuro: Cranial nerves II through XII grossly intact Skin: Warm and dry.  Assessment & Plan: Mihir was seen today for sinusitis.  Diagnoses and associated orders for this visit:  Sinusitis, chronic - CT Maxillofacial WO CM; Future - levofloxacin (LEVAQUIN) 500 MG tablet; Take 1 tablet (500 mg total) by mouth daily. - predniSONE (DELTASONE) 20 MG tablet; Three tabs at once daily for five days. - Ambulatory referral to ENT  Other Orders - HYDROcodone-homatropine (HYCODAN) 5-1.5 MG/5ML syrup; Take 5 mLs by mouth at bedtime as needed for cough.    Sinusitis: Still suspect there is a bacterial involvement here, I like him to start prednisone burst along with levofloxacin we will be getting nonemergent CT scan of the sinuses in preparation for referral to ear nose and throat.  Return if symptoms worsen or fail to improve.

## 2013-05-04 ENCOUNTER — Telehealth: Payer: Self-pay | Admitting: *Deleted

## 2013-05-04 ENCOUNTER — Other Ambulatory Visit: Payer: Self-pay | Admitting: Family Medicine

## 2013-05-04 NOTE — Telephone Encounter (Signed)
PA obtained for CT Maxillofacial w/o contrast. Auth # [id number]. Meyer Cory, LPN

## 2013-05-05 ENCOUNTER — Ambulatory Visit (INDEPENDENT_AMBULATORY_CARE_PROVIDER_SITE_OTHER): Payer: 59

## 2013-05-05 ENCOUNTER — Other Ambulatory Visit: Payer: Self-pay | Admitting: Family Medicine

## 2013-05-05 DIAGNOSIS — J329 Chronic sinusitis, unspecified: Secondary | ICD-10-CM

## 2013-05-05 DIAGNOSIS — J342 Deviated nasal septum: Secondary | ICD-10-CM

## 2013-05-12 ENCOUNTER — Other Ambulatory Visit: Payer: Self-pay

## 2013-05-18 ENCOUNTER — Other Ambulatory Visit: Payer: Self-pay | Admitting: Physician Assistant

## 2013-05-26 ENCOUNTER — Ambulatory Visit (INDEPENDENT_AMBULATORY_CARE_PROVIDER_SITE_OTHER): Payer: 59 | Admitting: Family Medicine

## 2013-05-26 ENCOUNTER — Encounter: Payer: Self-pay | Admitting: Family Medicine

## 2013-05-26 ENCOUNTER — Ambulatory Visit (INDEPENDENT_AMBULATORY_CARE_PROVIDER_SITE_OTHER): Payer: 59

## 2013-05-26 VITALS — BP 121/81 | HR 83 | Temp 97.7°F | Wt 274.0 lb

## 2013-05-26 DIAGNOSIS — L723 Sebaceous cyst: Secondary | ICD-10-CM

## 2013-05-26 DIAGNOSIS — R05 Cough: Secondary | ICD-10-CM

## 2013-05-26 DIAGNOSIS — Z23 Encounter for immunization: Secondary | ICD-10-CM

## 2013-05-26 DIAGNOSIS — L909 Atrophic disorder of skin, unspecified: Secondary | ICD-10-CM

## 2013-05-26 DIAGNOSIS — L720 Epidermal cyst: Secondary | ICD-10-CM

## 2013-05-26 DIAGNOSIS — L918 Other hypertrophic disorders of the skin: Secondary | ICD-10-CM

## 2013-05-26 DIAGNOSIS — R918 Other nonspecific abnormal finding of lung field: Secondary | ICD-10-CM

## 2013-05-26 DIAGNOSIS — R059 Cough, unspecified: Secondary | ICD-10-CM

## 2013-05-26 NOTE — Progress Notes (Signed)
  Subjective:    Patient ID: Mitchell Medina, male    DOB: [birth year], 57 y.o.   MRN: [id number]  HPI Skin tags left axilla.  Irritated and would like them removed. He also has a cyst on his forehead he would like removed as well.   He's been seen 3 times her office for sinusitis/bronchitis-his sinus symptoms are significantly better. He is seeing ENT and has put him on a nasal steroid wash. That has helped. He still has a significant productive cough that is sporadic. No fevers chills or sweats. No shortness of breath.  Review of Systems     Objective:   Physical Exam  Constitutional: He is oriented to person, place, and time. He appears well-developed and well-nourished.  HENT:  Head: Normocephalic and atraumatic.  Cardiovascular: Normal rate, regular rhythm and normal heart sounds.   Pulmonary/Chest: Effort normal and breath sounds normal.  Neurological: He is alert and oriented to person, place, and time.  Skin: Skin is warm and dry.  3 skin tags under the left axilla  Psychiatric: He has a normal mood and affect. His behavior is normal.          Assessment & Plan:  Skin tags-removed easily during the office visit today.  Cyst on forehead-unclear if this is a sebaceous cyst or a lipoma. I would actually like to see dermatology to have this removed. I want him to have the best cosmetic outcome.  Cough-consider postinfectious cough. He's not had any fevers and chills and his exam is normal today but he says he for workup with chest x-ray just to make sure that underlying infection that has not resolved. We'll order chest call him with results later today. If negative then can certainly use over-the-counter cough suppressants if needed. Right humidifier. And give this time.  Flu shot given.   Skin Tag Removal Procedure Note  Pre-operative Diagnosis: Classic skin tags (acrochordon)  Post-operative Diagnosis: Classic skin tags (acrochordon)  Locations:left  axilla  Indications: irritated  Anesthesia: not required    Procedure Details  The risks (including bleeding and infection) and benefits of the procedure and Verbal informed consent obtained. Using sterile iris scissors, multiple skin tags were snipped off at their bases after cleansing with Betadine.  Bleeding was controlled by pressure.   Findings: Pathognomonic benign lesions  not sent for pathological exam.  Condition: Stable  Complications: none.  Plan: 1. Instructed to keep the wounds dry and covered for 24-48h and clean thereafter. 2. Warning signs of infection were reviewed.   3. Recommended that the patient use OTC acetaminophen as needed for pain.  4. Return as needed.

## 2013-05-27 ENCOUNTER — Telehealth: Payer: Self-pay

## 2013-05-27 DIAGNOSIS — R911 Solitary pulmonary nodule: Secondary | ICD-10-CM

## 2013-05-27 NOTE — Telephone Encounter (Signed)
Mitchell Medina does not want to wait 3 months for another chest xray. He would like a CT scan. Is this appropriate?

## 2013-05-30 NOTE — Telephone Encounter (Signed)
CXR would be better choice, but we can order Chest CT. Order placed.

## 2013-05-31 ENCOUNTER — Telehealth: Payer: Self-pay | Admitting: *Deleted

## 2013-05-31 ENCOUNTER — Encounter: Payer: Self-pay | Admitting: Family Medicine

## 2013-05-31 ENCOUNTER — Ambulatory Visit (INDEPENDENT_AMBULATORY_CARE_PROVIDER_SITE_OTHER): Payer: 59 | Admitting: Family Medicine

## 2013-05-31 VITALS — BP 139/88 | HR 57 | Temp 98.1°F

## 2013-05-31 DIAGNOSIS — R911 Solitary pulmonary nodule: Secondary | ICD-10-CM

## 2013-05-31 DIAGNOSIS — R05 Cough: Secondary | ICD-10-CM

## 2013-05-31 NOTE — Telephone Encounter (Signed)
Discussed at appt.Loralee Pacas [place]

## 2013-05-31 NOTE — Telephone Encounter (Signed)
PA obtained for CT Chest w/o contrast. Auth # [id number].  Meyer Cory, LPN

## 2013-05-31 NOTE — Progress Notes (Signed)
  Subjective:    Patient ID: Mitchell Medina, male    DOB: Dec 28, 1955, 57 y.o.   MRN: [id number]  HPI Here to followup on abnormal chest x-ray. There was no sign of infection or bronchitis which is what we're looking for but there was a lung nodule seen on the right side. He has never been a smoker. No significant exposure as far as job her environment. He has not lived in the [place]. And no exposure to TB during his lifetime that he is aware of. Overall his cough has been improving since I last saw him. He came in because he would really like to get a CT instead of waiting for followup the chest x-ray. He's very nervous about that. His wife is here with him today. There was no old chest x-ray for comparison.   Review of Systems     Objective:   Physical Exam  Constitutional: He is oriented to person, place, and time. He appears well-developed and well-nourished.  HENT:  Head: Normocephalic and atraumatic.  Cardiovascular: Normal rate, regular rhythm and normal heart sounds.   Pulmonary/Chest: Effort normal and breath sounds normal.  Neurological: He is alert and oriented to person, place, and time.  Skin: Skin is warm and dry.  Psychiatric: He has a normal mood and affect. His behavior is normal.          Assessment & Plan:  The lung nodule-overall patient is fairly low risk for lung cancer but we'll go ahead and move forward with CT of the chest. Explained to him that it is more radiation than a chest x-ray and will likely still need to be followed up. We can certainly better characterize the lesion to see if it has more normal or worrisome features.  Post infectious cough-improving. Given sample of albuterol today.

## 2013-06-06 ENCOUNTER — Ambulatory Visit (INDEPENDENT_AMBULATORY_CARE_PROVIDER_SITE_OTHER): Payer: 59

## 2013-06-06 DIAGNOSIS — J8410 Pulmonary fibrosis, unspecified: Secondary | ICD-10-CM

## 2013-06-06 DIAGNOSIS — R911 Solitary pulmonary nodule: Secondary | ICD-10-CM

## 2013-06-27 ENCOUNTER — Other Ambulatory Visit: Payer: Self-pay | Admitting: Family Medicine

## 2013-06-27 ENCOUNTER — Other Ambulatory Visit: Payer: Self-pay | Admitting: Sports Medicine

## 2013-06-28 ENCOUNTER — Other Ambulatory Visit: Payer: Self-pay | Admitting: *Deleted

## 2013-06-28 MED ORDER — LISINOPRIL-HYDROCHLOROTHIAZIDE 20-12.5 MG PO TABS: ORAL_TABLET | ORAL | Status: DC

## 2013-07-27 ENCOUNTER — Encounter: Payer: Self-pay | Admitting: Physician Assistant

## 2013-07-27 ENCOUNTER — Ambulatory Visit (INDEPENDENT_AMBULATORY_CARE_PROVIDER_SITE_OTHER): Payer: 59 | Admitting: Physician Assistant

## 2013-07-27 VITALS — BP 142/90 | HR 95 | Wt 278.0 lb

## 2013-07-27 DIAGNOSIS — J019 Acute sinusitis, unspecified: Secondary | ICD-10-CM

## 2013-07-27 DIAGNOSIS — R51 Headache: Secondary | ICD-10-CM

## 2013-07-27 MED ORDER — METHYLPREDNISOLONE SODIUM SUCC 125 MG IJ SOLR
125.0000 mg | Freq: Once | INTRAMUSCULAR | Status: AC
  Administered 2013-07-27: 125 mg via INTRAMUSCULAR

## 2013-07-27 MED ORDER — AMOXICILLIN 500 MG PO TABS: 500.0000 mg | ORAL_TABLET | Freq: Two times a day (BID) | ORAL | Status: DC

## 2013-07-27 NOTE — Progress Notes (Signed)
   Subjective:    Patient ID: Mitchell NonesBenjamin E Yakubov, male    DOB: 09/16/1955, 58 y.o.   MRN: [id number]  HPI Pt presents to the clinic with headaches. Pt does not have hx of headaches. They started about 6 days ago and have occurred every day about lunch time. excedrin migraine make about 50 percent better. HA still present when he goes to sleep but wakes up headache free. HA's are located on the left side only. Not started any new medication. Does have some left sided ear pain along with left sided sinus pressure. [place] He is retired. No extensive alcohol use. Denies any light, sound sensitivity. No nausea or vomiting. No injuries to neck or any neck pain.     Review of Systems     Objective:   Physical Exam  Constitutional: He is oriented to person, place, and time. He appears well-developed and well-nourished.  HENT:  Head: Normocephalic and atraumatic.  Right Ear: External ear normal.  Left Ear: External ear normal.  Nose: Nose normal.  Mouth/Throat: Oropharynx is clear and moist.  TM's clear bilaterally.   Left sided only significant maxillary sinus tenderness to palpation.   Eyes: Conjunctivae and EOM are normal. Pupils are equal, round, and reactive to light. Right eye exhibits no discharge. Left eye exhibits no discharge.  Neck: Normal range of motion. Neck supple.  Cardiovascular: Normal rate, regular rhythm and normal heart sounds.   Pulmonary/Chest: Effort normal and breath sounds normal. He has no wheezes.  Lymphadenopathy:    He has no cervical adenopathy.  Neurological: He is alert and oriented to person, place, and time. No cranial nerve deficit.  Skin:  Flushed cheeks bilaterally.   Psychiatric: He has a normal mood and affect. His behavior is normal.          Assessment & Plan:  Left sided headaches/sinusitis- Unclear etiology at this point. Certainly since duration is less than a week and suddenly, sinusitis could be a  contributing factor. I am going to treat with shout of solumedrol in office today and amoxil for 10 days. Consider sinus rinses. Ibuprofen for any lingering headache. I am concerned because headaches are occuring the same time of day. This could be start of cluster headaches. If not improving after sinusitis treatment then can talk about other treatment. Continue on xyzal, qnasl. Follow up if symptoms not improving.

## 2013-07-27 NOTE — Patient Instructions (Signed)

## 2013-09-29 ENCOUNTER — Other Ambulatory Visit: Payer: Self-pay | Admitting: Family Medicine

## 2013-12-09 IMAGING — CT CT CHEST W/O CM
2 of 4 series · 15 of 36 positions shown, 18 images · non-contrast
Comparison: Chest radiograph on 05/26/13

CLINICAL DATA: Right lung nodule seen on recent chest radiograph.

EXAM:
CT CHEST WITHOUT CONTRAST
TECHNIQUE: Multidetector CT imaging of the chest was performed following the
standard protocol without IV contrast.

[Series 2: chest w/o · axial · non-contrast · 0.98mm/px · z∈[-364,-89]mm · 12 of 66 slices shown, 15 images]
[im 6/66  mediastinal]
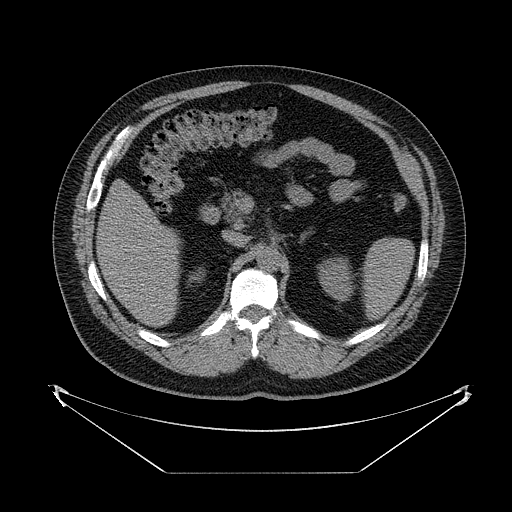
[im 6/66  lung]
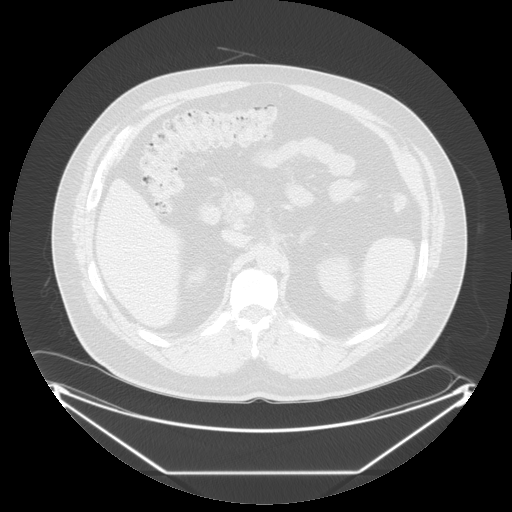
[im 11/66  lung]
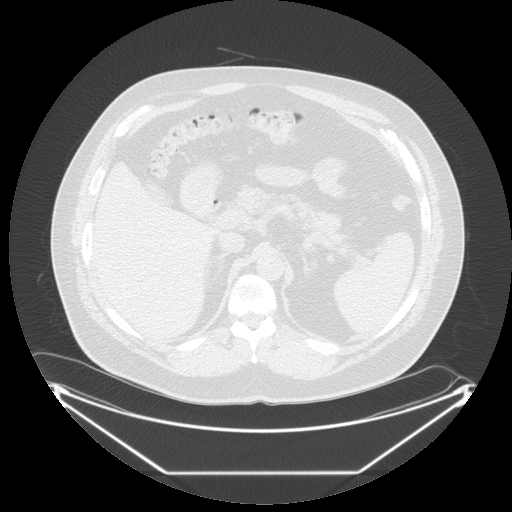
[im 16/66  lung]
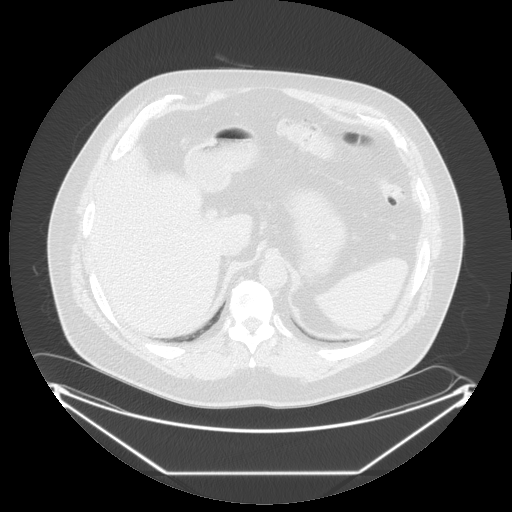
[im 21/66  lung]
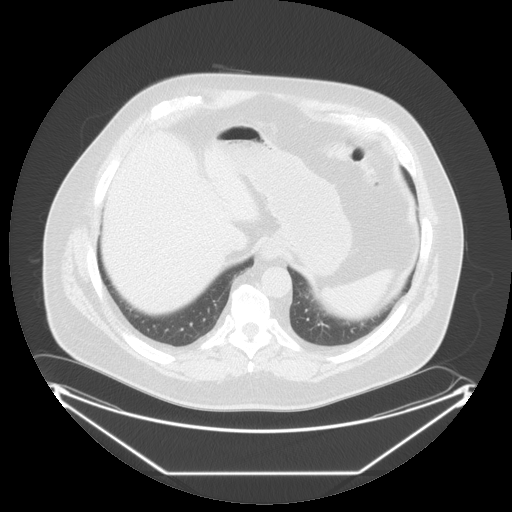
[im 26/66  mediastinal]
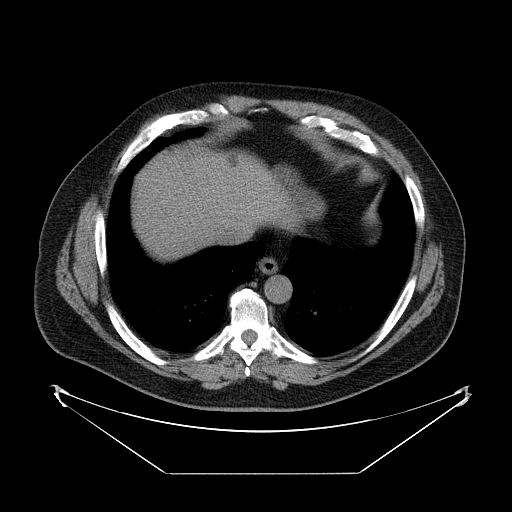
[im 26/66  lung]
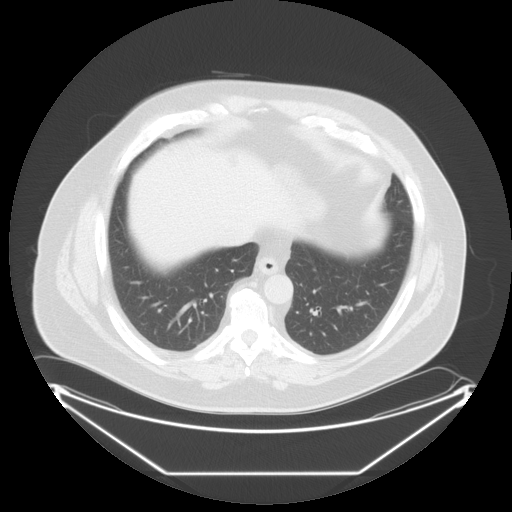
[im 31/66  lung]
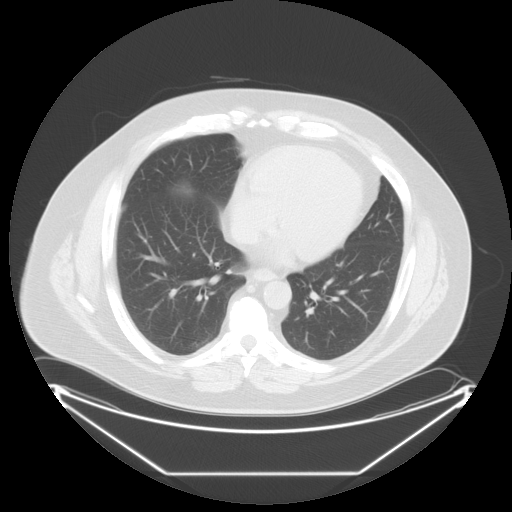
[im 36/66  lung]
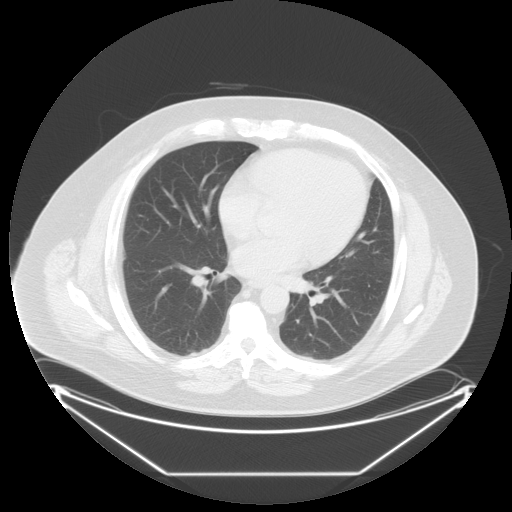
[im 41/66  lung]
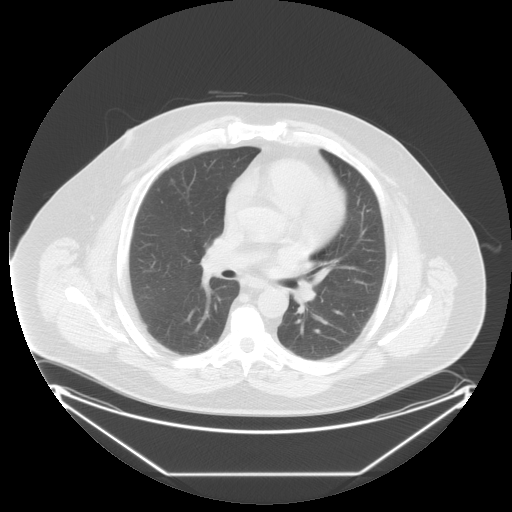
[im 46/66  mediastinal]
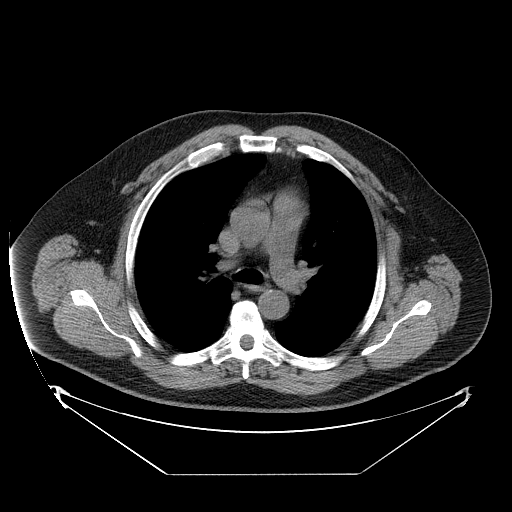
[im 46/66  lung]
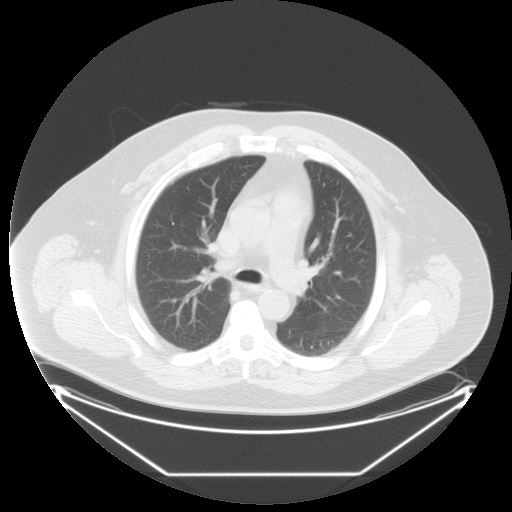
[im 51/66  lung]
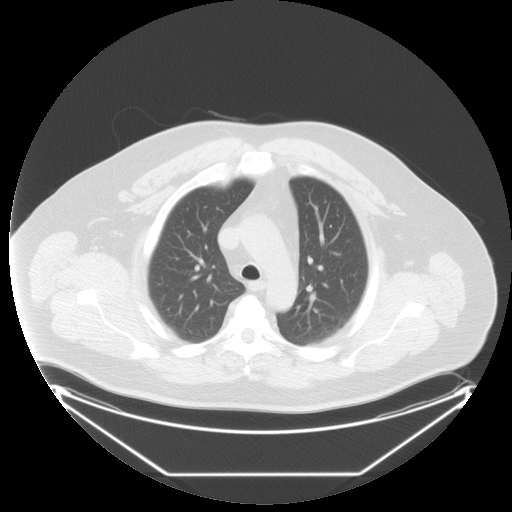
[im 56/66  lung]
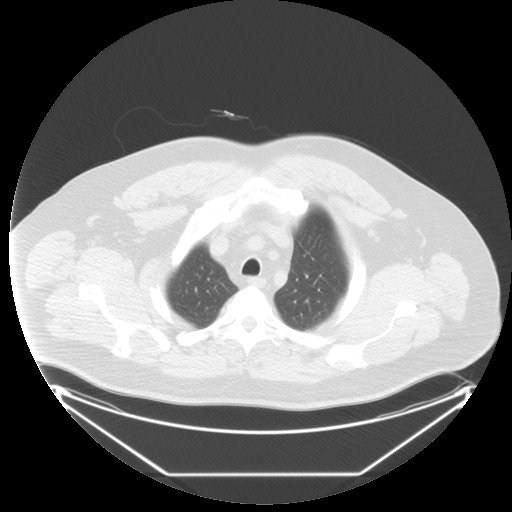
[im 61/66  lung]
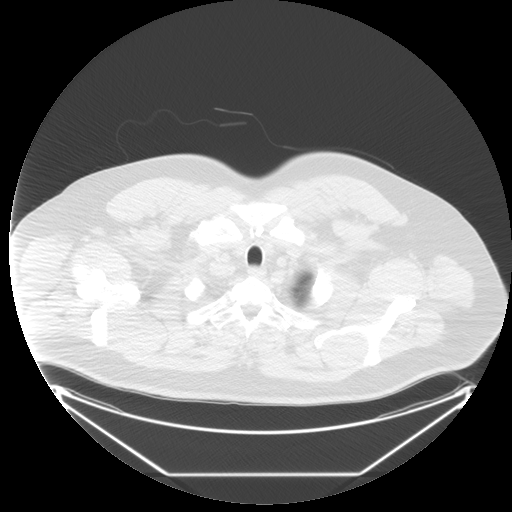

[Series 401: sag · sagittal · 0.98mm/px · 3 of 243 slices shown]
[im 49/243  lung]
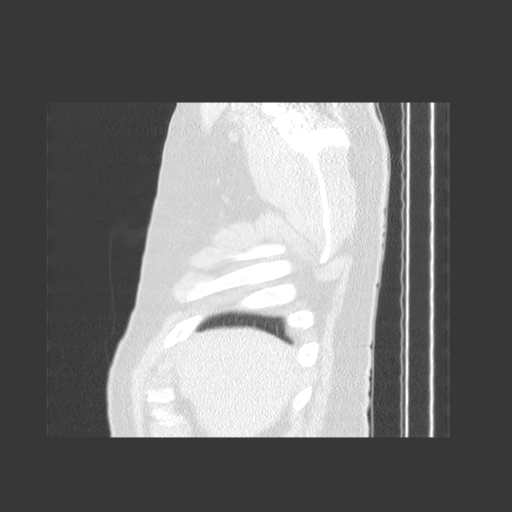
[im 97/243  lung]
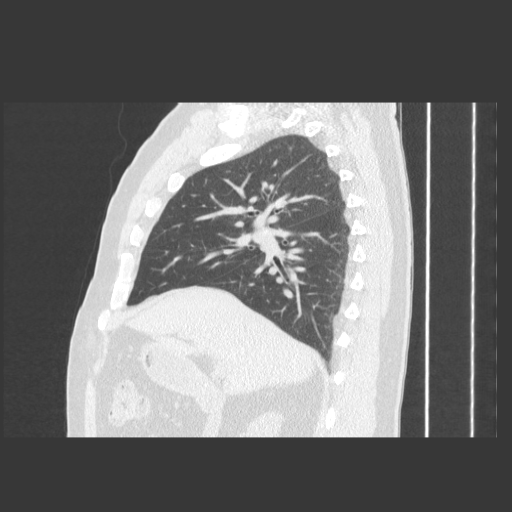
[im 146/243  lung]
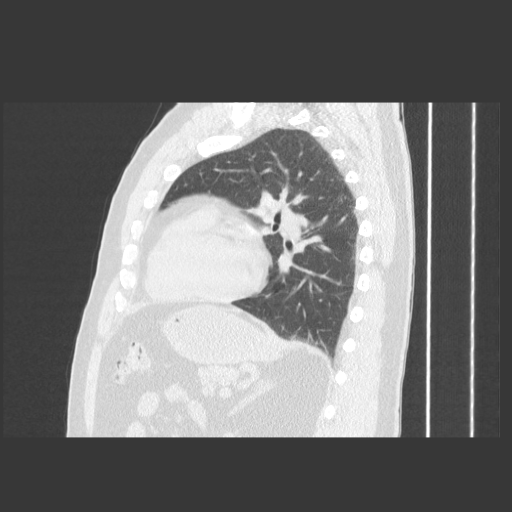

[15 of 36 positions shown; findings below may reference images not displayed]

FINDINGS: A tiny calcified granuloma is seen in the posterior right upper lobe
which corresponds to the nodular density seen on recent chest
radiograph. No suspicious pulmonary nodules or masses are
identified.

No evidence of pulmonary infiltrate or central endobronchial lesion.
No evidence of pleural or pericardial effusion. No evidence of hilar
or mediastinal masses. No adenopathy seen elsewhere within the
thorax. No evidence of chest wall mass or suspicious bone lesions.
IMPRESSION: Tiny benign calcified granuloma in the posterior right upper lobe.
No evidence of neoplasm or other active disease.

## 2013-12-27 ENCOUNTER — Other Ambulatory Visit: Payer: Self-pay | Admitting: Family Medicine

## 2014-08-31 ENCOUNTER — Other Ambulatory Visit: Payer: Self-pay | Admitting: *Deleted

## 2014-08-31 DIAGNOSIS — I10 Essential (primary) hypertension: Secondary | ICD-10-CM

## 2014-08-31 DIAGNOSIS — E785 Hyperlipidemia, unspecified: Secondary | ICD-10-CM

## 2014-08-31 DIAGNOSIS — Z114 Encounter for screening for human immunodeficiency virus [HIV]: Secondary | ICD-10-CM

## 2014-08-31 DIAGNOSIS — Z0000 Encounter for general adult medical examination without abnormal findings: Secondary | ICD-10-CM

## 2014-09-01 ENCOUNTER — Ambulatory Visit (INDEPENDENT_AMBULATORY_CARE_PROVIDER_SITE_OTHER): Payer: BC Managed Care – PPO | Admitting: Family Medicine

## 2014-09-01 ENCOUNTER — Encounter: Payer: Self-pay | Admitting: Family Medicine

## 2014-09-01 VITALS — BP 140/88 | HR 56 | Wt 278.0 lb

## 2014-09-01 DIAGNOSIS — E785 Hyperlipidemia, unspecified: Secondary | ICD-10-CM

## 2014-09-01 DIAGNOSIS — Z0189 Encounter for other specified special examinations: Secondary | ICD-10-CM

## 2014-09-01 DIAGNOSIS — J302 Other seasonal allergic rhinitis: Secondary | ICD-10-CM

## 2014-09-01 DIAGNOSIS — Z0000 Encounter for general adult medical examination without abnormal findings: Secondary | ICD-10-CM

## 2014-09-01 DIAGNOSIS — I10 Essential (primary) hypertension: Secondary | ICD-10-CM

## 2014-09-01 LAB — COMPLETE METABOLIC PANEL WITH GFR
ALT: 21 U/L (ref 0–53)
AST: 27 U/L (ref 0–37)
Albumin: 4.4 g/dL (ref 3.5–5.2)
Alkaline Phosphatase: 77 U/L (ref 39–117)
BUN: 17 mg/dL (ref 6–23)
CO2: 28 meq/L (ref 19–32)
Calcium: 9.8 mg/dL (ref 8.4–10.5)
Chloride: 100 mEq/L (ref 96–112)
Creat: 1.05 mg/dL (ref 0.50–1.35)
GFR, Est African American: >89 mL/min
GFR, Est Non African American: 78 mL/min
GLUCOSE: 98 mg/dL (ref 70–99)
POTASSIUM: 4.4 meq/L (ref 3.5–5.3)
SODIUM: 137 meq/L (ref 135–145)
Total Bilirubin: 0.5 mg/dL (ref 0.2–1.2)
Total Protein: 7.2 g/dL (ref 6.0–8.3)

## 2014-09-01 LAB — LIPID PANEL
Cholesterol: 210 mg/dL — ABNORMAL HIGH (ref 0–200)
HDL: 43 mg/dL (ref >=40)
LDL CALC: 135 mg/dL — AB (ref 0–99)
Total CHOL/HDL Ratio: 4.9 Ratio
Triglycerides: 162 mg/dL — ABNORMAL HIGH (ref <150)
VLDL: 32 mg/dL (ref 0–40)

## 2014-09-01 MED ORDER — LISINOPRIL-HYDROCHLOROTHIAZIDE 20-12.5 MG PO TABS: ORAL_TABLET | ORAL | Status: DC

## 2014-09-01 NOTE — Progress Notes (Signed)
Subjective:    Patient ID: Mitchell Medina, male    DOB: [birth year], 59 y.o.   MRN: [id number]  HPI  Here for CPE - He has been off his lisinopril and Crestor for months.  He has not followed up for routine visit in over a year.   Hypertension- Pt denies chest pain, SOB, dizziness, or heart palpitations.  Taking meds as directed w/o problems.  Denies medication side effects.    Hyperlipidemia - right now doing krill oil daily and off the Crestor.    AR- using nasal rinse and off the xyzal.  Feels this has wored better for him in reducing his sinus infection.  He has not had a sinus infection almost 8 months which is great for him.   Review of Systems Comprehensive review of systems is negative.  BP 140/88 mmHg  Pulse 56  Wt 278 lb (126.1 kg)  SpO2 98%    No Known Allergies  Past Medical History  Diagnosis Date  . Hypertension   . Hyperlipidemia   . OSA (obstructive sleep apnea)   . Asthma   . Shoulder bursitis   . Allergy     Past Surgical History  Procedure Laterality Date  . Anterior cruciate ligament repair  2005    ACL repair  RT 2011  . Tonsillectomy and adenoidectomy      History   Social History  . Marital Status: Married    Spouse Name: N/A  . Number of Children: 0  . Years of Education: N/A   Occupational History  . Not on file.   Social History Main Topics  . Smoking status: Never Smoker   . Smokeless tobacco: Not on file  . Alcohol Use: 0.6 oz/week    1 Standard drinks or equivalent per week     Comment: month  . Drug Use: Not on file  . 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    Other Topics Concern  . Not on file   Social History Narrative   He is working out 6 days per weeks.  Retired form [employer].     Family History  Problem Relation Age of Onset  . Cancer Father     pancreatic CA  . Hypertension Father     Outpatient Encounter  Prescriptions as of 09/01/2014  Medication Sig  . aspirin 81 MG EC tablet Take 81 mg by mouth daily.    . fluticasone (VERAMYST) 27.5 MCG/SPRAY nasal spray Place 2 sprays into the nose daily.  . Omega-3 Krill Oil 500 MG CAPS Take by mouth.  Marland Kitchen lisinopril-hydrochlorothiazide (PRINZIDE,ZESTORETIC) 20-12.5 MG per tablet TAKE ONE TABLET BY MOUTH ONE TIME DAILY  . rosuvastatin (CRESTOR) 20 MG tablet TAKE ONE TABLET BY MOUTH ONE TIME DAILY (Patient not taking: Reported on 09/01/2014)  . [DISCONTINUED] albuterol (PROVENTIL HFA;VENTOLIN HFA) 108 (90 BASE) MCG/ACT inhaler Inhale 2 puffs into the lungs every 6 (six) hours as needed.  . [DISCONTINUED] amoxicillin (AMOXIL) 500 MG tablet Take 1 tablet (500 mg total) by mouth 2 (two) times daily. For 10 days.  . [DISCONTINUED] Beclomethasone Dipropionate 80 MCG/ACT AERS Place 2 application into the nose daily.  . [DISCONTINUED] levocetirizine (XYZAL) 5 MG tablet Take 1 tablet by mouth once daily in the evening.  . [DISCONTINUED] lisinopril-hydrochlorothiazide (PRINZIDE,ZESTORETIC) 20-12.5 MG per tablet TAKE ONE TABLET BY MOUTH ONE TIME DAILY (Patient not taking: Reported on 09/01/2014)  Objective:   Physical Exam  Constitutional: He is oriented to person, place, and time. He appears well-developed and well-nourished.  HENT:  Head: Normocephalic and atraumatic.  Right Ear: External ear normal.  Left Ear: External ear normal.  Nose: Nose normal.  Mouth/Throat: Oropharynx is clear and moist.  Eyes: Conjunctivae and EOM are normal. Pupils are equal, round, and reactive to light.  Neck: Normal range of motion. Neck supple. No thyromegaly present.  Cardiovascular: Normal rate, regular rhythm, normal heart sounds and intact distal pulses.   Pulmonary/Chest: Effort normal and breath sounds normal.  Abdominal: Soft. Bowel sounds are normal. He exhibits no distension and no mass. There is no tenderness. There is no rebound and no guarding.   Musculoskeletal: Normal range of motion.  Lymphadenopathy:    He has no cervical adenopathy.  Neurological: He is alert and oriented to person, place, and time. He has normal reflexes.  Skin: Skin is warm and dry.  Psychiatric: He has a normal mood and affect. His behavior is normal. Judgment and thought content normal.          Assessment & Plan:  CPE -  Keep up a regular exercise program and make sure you are eating a healthy diet Try to eat 4 servings of dairy a day, or if you are lactose intolerant take a calcium with vitamin D daily.  Your vaccines are up to date.   AR- doing well just nasal saline rinses as well as using Flonase intermittently.  Hyperlipidemia - right now he is just taking krill oil. We will recheck his cholesterol today. He has been exercising regularly so hopefully his numbers will look great. If not then consider restarting the Crestor. He was unable to reach his stroke was previously with lovastatin.   HTN - uncontrolled. We will restart his medication and have him follow-up in 4-6 weeks to recheck his blood pressure. Due for CMP and lipids.

## 2014-09-02 LAB — PSA: PSA: 0.19 ng/mL (ref <=4.00)

## 2014-09-02 LAB — HIV ANTIBODY (ROUTINE TESTING W REFLEX): HIV 1&2 Ab, 4th Generation: NONREACTIVE

## 2014-09-06 ENCOUNTER — Other Ambulatory Visit: Payer: Self-pay

## 2014-09-06 ENCOUNTER — Encounter: Payer: Self-pay | Admitting: Family Medicine

## 2014-09-06 MED ORDER — ROSUVASTATIN CALCIUM 20 MG PO TABS: ORAL_TABLET | ORAL | Status: DC

## 2014-10-19 ENCOUNTER — Other Ambulatory Visit: Payer: Self-pay | Admitting: Family Medicine

## 2015-01-01 ENCOUNTER — Ambulatory Visit: Payer: Self-pay | Admitting: Family Medicine

## 2015-01-13 ENCOUNTER — Other Ambulatory Visit: Payer: Self-pay | Admitting: Family Medicine

## 2015-05-22 ENCOUNTER — Other Ambulatory Visit: Payer: Self-pay | Admitting: *Deleted

## 2015-05-22 ENCOUNTER — Encounter: Payer: Self-pay | Admitting: Family Medicine

## 2015-05-22 MED ORDER — LISINOPRIL-HYDROCHLOROTHIAZIDE 20-12.5 MG PO TABS: 1.0000 | ORAL_TABLET | Freq: Every day | ORAL | Status: DC

## 2015-07-08 ENCOUNTER — Other Ambulatory Visit: Payer: Self-pay | Admitting: Family Medicine

## 2015-09-21 ENCOUNTER — Other Ambulatory Visit: Payer: Self-pay | Admitting: *Deleted

## 2015-09-21 DIAGNOSIS — I10 Essential (primary) hypertension: Secondary | ICD-10-CM

## 2015-09-21 DIAGNOSIS — R7301 Impaired fasting glucose: Secondary | ICD-10-CM

## 2015-09-21 DIAGNOSIS — Z1159 Encounter for screening for other viral diseases: Secondary | ICD-10-CM

## 2015-09-21 DIAGNOSIS — E785 Hyperlipidemia, unspecified: Secondary | ICD-10-CM

## 2015-09-21 DIAGNOSIS — Z0000 Encounter for general adult medical examination without abnormal findings: Secondary | ICD-10-CM

## 2015-12-22 LAB — COMPLETE METABOLIC PANEL WITH GFR
ALBUMIN: 4.3 g/dL (ref 3.6–5.1)
ALT: 20 U/L (ref 9–46)
AST: 23 U/L (ref 10–35)
Alkaline Phosphatase: 75 U/L (ref 40–115)
BILIRUBIN TOTAL: 0.5 mg/dL (ref 0.2–1.2)
BUN: 14 mg/dL (ref 7–25)
CO2: 29 mmol/L (ref 20–31)
Calcium: 9.5 mg/dL (ref 8.6–10.3)
Chloride: 102 mmol/L (ref 98–110)
Creat: 1.02 mg/dL (ref 0.70–1.33)
GFR, Est African American: >89 mL/min (ref >=60)
GFR, Est Non African American: 80 mL/min (ref >=60)
Glucose, Bld: 97 mg/dL (ref 65–99)
Potassium: 4.2 mmol/L (ref 3.5–5.3)
SODIUM: 136 mmol/L (ref 135–146)
TOTAL PROTEIN: 7 g/dL (ref 6.1–8.1)

## 2015-12-22 LAB — HEMOGLOBIN A1C
Hgb A1c MFr Bld: 5.9 % — ABNORMAL HIGH (ref <5.7)
MEAN PLASMA GLUCOSE: 123 mg/dL

## 2015-12-22 LAB — LIPID PANEL
Cholesterol: 168 mg/dL (ref 125–200)
HDL: 52 mg/dL (ref >=40)
LDL CALC: 96 mg/dL (ref <130)
TRIGLYCERIDES: 101 mg/dL (ref <150)
Total CHOL/HDL Ratio: 3.2 Ratio (ref <=5.0)
VLDL: 20 mg/dL (ref <30)

## 2015-12-22 LAB — HEPATITIS C ANTIBODY: HCV AB: NEGATIVE

## 2015-12-22 LAB — PSA: PSA: 0.2 ng/mL (ref <=4.00)

## 2015-12-24 ENCOUNTER — Encounter: Payer: Self-pay | Admitting: Family Medicine

## 2015-12-24 ENCOUNTER — Ambulatory Visit (INDEPENDENT_AMBULATORY_CARE_PROVIDER_SITE_OTHER): Payer: 59 | Admitting: Family Medicine

## 2015-12-24 ENCOUNTER — Ambulatory Visit: Payer: Self-pay | Admitting: Family Medicine

## 2015-12-24 ENCOUNTER — Ambulatory Visit: Payer: Self-pay | Admitting: Physician Assistant

## 2015-12-24 VITALS — BP 181/79 | HR 62 | Ht 73.0 in | Wt 275.0 lb

## 2015-12-24 DIAGNOSIS — E785 Hyperlipidemia, unspecified: Secondary | ICD-10-CM

## 2015-12-24 DIAGNOSIS — Z0000 Encounter for general adult medical examination without abnormal findings: Secondary | ICD-10-CM

## 2015-12-24 DIAGNOSIS — I10 Essential (primary) hypertension: Secondary | ICD-10-CM

## 2015-12-24 DIAGNOSIS — IMO0001 Reserved for inherently not codable concepts without codable children: Secondary | ICD-10-CM

## 2015-12-24 DIAGNOSIS — N400 Enlarged prostate without lower urinary tract symptoms: Secondary | ICD-10-CM | POA: Diagnosis not present

## 2015-12-24 DIAGNOSIS — R7301 Impaired fasting glucose: Secondary | ICD-10-CM

## 2015-12-24 MED ORDER — LISINOPRIL-HYDROCHLOROTHIAZIDE 20-12.5 MG PO TABS: 1.0000 | ORAL_TABLET | Freq: Every day | ORAL | Status: DC

## 2015-12-24 MED ORDER — TAMSULOSIN HCL 0.4 MG PO CAPS: 0.4000 mg | ORAL_CAPSULE | Freq: Every day | ORAL | Status: DC

## 2015-12-24 MED ORDER — ROSUVASTATIN CALCIUM 20 MG PO TABS: 20.0000 mg | ORAL_TABLET | Freq: Every day | ORAL | Status: DC

## 2015-12-24 NOTE — Progress Notes (Signed)
Mitchell Medina is a 60 y.o. male who presents to [facility]: Primary Care [facility] Medicine today for well visit. Patient lives in [place] most of the time but has his medical care at this office. He typically sees Dr. Linford Arnold however she is out of town this weekend he cannot reschedule. He feels well with the exception of some urinary frequency and weak urinary stream over the past several months. No fevers chills nausea vomiting or diarrhea. He notes that when he checks his blood pressure at home and typically is in the 130s over 70s range. He feels well otherwise.  AUA symptom score is 11 with mostly dissatisfied quality-of-life symptom score.  Past Medical History  Diagnosis Date  . Hypertension   . Hyperlipidemia   . OSA (obstructive sleep apnea)   . Asthma   . Shoulder bursitis   . Allergy    Past Surgical History  Procedure Laterality Date  . Anterior cruciate ligament repair  2005    ACL repair  RT 2011  . Tonsillectomy and adenoidectomy     Social History  Substance Use Topics  . Smoking status: Never Smoker   . Smokeless tobacco: Not on file  . Alcohol Use: 0.6 oz/week    1 Standard drinks or equivalent per week     Comment: month   family history includes Cancer in his father; Hypertension in his father.  ROS as above:  Medications: Current Outpatient Prescriptions  Medication Sig Dispense Refill  . aspirin 81 MG EC tablet Take 81 mg by mouth daily.      . fluticasone (VERAMYST) 27.5 MCG/SPRAY nasal spray Place 2 sprays into the nose daily.    Marland Kitchen lisinopril-hydrochlorothiazide (PRINZIDE,ZESTORETIC) 20-12.5 MG tablet Take 1 tablet by mouth daily. APPOINTMENT REQUIRED FOR FUTURE REFILLS 90 tablet 1  . Omega-3 Krill Oil 500 MG CAPS Take by mouth.    . rosuvastatin (CRESTOR) 20 MG tablet Take 1 tablet (20 mg total) by mouth daily. 90 tablet 1  . tamsulosin (FLOMAX) 0.4 MG  CAPS capsule Take 1 capsule (0.4 mg total) by mouth daily. 90 capsule 1   No current facility-administered medications for this visit.   No Known Allergies   Exam:  BP 181/79 mmHg  Pulse 62  Ht 6\' 1"  (1.854 m)  Wt 275 lb (124.739 kg)  BMI 36.29 kg/m2 Gen: Well NAD HEENT: EOMI,  MMM Lungs: Normal work of breathing. CTABL Heart: RRR no MRG Abd: NABS, Soft. Nondistended, Nontender Exts: Brisk capillary refill, warm and well perfused.  Rectal exam: Normal anus. Rest it is large but without nodules tenderness and is not boggy.   Recent Results (from the past 2160 hour(s))  COMPLETE METABOLIC PANEL WITH GFR     Status: None   Collection Time: 12/21/15  8:03 AM  Result Value Ref Range   Sodium 136 135 - 146 mmol/L   Potassium 4.2 3.5 - 5.3 mmol/L   Chloride 102 98 - 110 mmol/L   CO2 29 20 - 31 mmol/L   Glucose, Bld 97 65 - 99 mg/dL   BUN 14 7 - 25 mg/dL   Creat 1.61 0.96 - 0.45 mg/dL   Total Bilirubin 0.5 0.2 - 1.2 mg/dL   Alkaline Phosphatase 75 40 - 115 U/L   AST 23 10 - 35 U/L   ALT 20 9 - 46 U/L   Total Protein 7.0 6.1 - 8.1 g/dL   Albumin 4.3 3.6 - 5.1 g/dL   Calcium  9.5 8.6 - 10.3 mg/dL   GFR, Est African American >89 >=60 mL/min   GFR, Est Non African American 80 >=60 mL/min  Lipid panel     Status: None   Collection Time: 12/21/15  8:03 AM  Result Value Ref Range   Cholesterol 168 125 - 200 mg/dL   Triglycerides 161 <096 mg/dL   HDL 52 >=04 mg/dL   Total CHOL/HDL Ratio 3.2 <=5.0 Ratio   VLDL 20 <30 mg/dL   LDL Cholesterol 96 <540 mg/dL    Comment:   Total Cholesterol/HDL Ratio:CHD Risk                        Coronary Heart Disease Risk Table                                        Men       Women          1/2 Average Risk              3.4        3.3              Average Risk              5.0        4.4           2X Average Risk              9.6        7.1           3X Average Risk             23.4       11.0 Use the calculated Patient Ratio above and the CHD  Risk table  to determine the patient's CHD Risk.   PSA     Status: None   Collection Time: 12/21/15  8:03 AM  Result Value Ref Range   PSA 0.20 <=4.00 ng/mL    Comment: Test Methodology: ECLIA PSA (Electrochemiluminescence Immunoassay)   For PSA values from 2.5-4.0, particularly in younger men <29 years old, the AUA and NCCN suggest testing for % Free PSA (3515) and evaluation of the rate of increase in PSA (PSA velocity).   HgB A1c     Status: Abnormal   Collection Time: 12/21/15  8:03 AM  Result Value Ref Range   Hgb A1c MFr Bld 5.9 (H) <5.7 %    Comment:   For someone without known diabetes, a hemoglobin A1c value between 5.7% and 6.4% is consistent with prediabetes and should be confirmed with a follow-up test.   For someone with known diabetes, a value <7% indicates that their diabetes is well controlled. A1c targets should be individualized based on duration of diabetes, age, co-morbid conditions and other considerations.   This assay result is consistent with an increased risk of diabetes.   Currently, no consensus exists regarding use of hemoglobin A1c for diagnosis of diabetes in children.      Mean Plasma Glucose 123 mg/dL  Hepatitis C antibody     Status: None   Collection Time: 12/21/15  8:03 AM  Result Value Ref Range   HCV Ab NEGATIVE NEGATIVE     No results found for this or any previous visit (from the past 24 hour(s)). No results found.    Assessment and Plan: 60 y.o. male  with well visit. Doing well. Labs reviewed. Medications refilled. Health maintenance up-to-date. Vaccines up-to-date. Follow-up with PCP in 6-12 months.   Patient has some BPH symptoms. His AUA score puts him in the moderate range and his quality of life score is impaired. Urine culture pending. PSA normal. Start Flomax recheck in 6-12 months.  Discussed warning signs or symptoms. Please see discharge instructions. Patient expresses understanding.

## 2015-12-24 NOTE — Patient Instructions (Signed)
Thank you for coming in today. Return in 6-12 months.  Start flomax for Urinary Symptoms.  Check blood pressure at home regularly.  I recommend that you establish care with a doctor in [place].   Call or go to the emergency room if you get worse, have trouble breathing, have chest pains, or palpitations.   Benign Prostatic Hyperplasia An enlarged prostate (benign prostatic hyperplasia) is common in older men. You may experience the following:  Weak urine stream.  Dribbling.  Feeling like the bladder has not emptied completely.  Difficulty starting urination.  Getting up frequently at night to urinate.  Urinating more frequently during the day. HOME CARE INSTRUCTIONS  Monitor your prostatic hyperplasia for any changes. The following actions may help to alleviate any discomfort you are experiencing:  Give yourself time when you urinate.  Stay away from alcohol.  Avoid beverages containing caffeine, such as coffee, tea, and colas, because they can make the problem worse.  Avoid decongestants, antihistamines, and some prescription medicines that can make the problem worse.  Follow up with your health care provider for further treatment as recommended. SEEK MEDICAL CARE IF:  You are experiencing progressive difficulty voiding.  Your urine stream is progressively getting narrower.  You are awaking from sleep with the urge to void more frequently.  You are constantly feeling the need to void.  You experience loss of urine, especially in small amounts. SEEK IMMEDIATE MEDICAL CARE IF:   You develop increased pain with urination or are unable to urinate.  You develop severe abdominal pain, vomiting, a high fever, or fainting.  You develop back pain or blood in your urine. MAKE SURE YOU:   Understand these instructions.  Will watch your condition.  Will get help right away if you are not doing well or get worse.   This information is not intended to replace advice  given to you by your health care provider. Make sure you discuss any questions you have with your health care provider.   Document Released: 06/23/2005 Document Revised: 07/14/2014 Document Reviewed: 11/23/2012 Elsevier Interactive Patient Education 2016 Elsevier Inc.   Tamsulosin capsules What is this medicine? TAMSULOSIN (tam SOO loe sin) is used to treat enlargement of the prostate gland in men, a condition called benign prostatic hyperplasia or BPH. It is not for use in women. It works by relaxing muscles in the prostate and bladder neck. This improves urine flow and reduces BPH symptoms. This medicine may be used for other purposes; ask your health care provider or pharmacist if you have questions. What should I tell my health care provider before I take this medicine? They need to know if you have any of the following conditions: -advanced kidney disease -advanced liver disease -low blood pressure -prostate cancer -an unusual or allergic reaction to tamsulosin, sulfa drugs, other medicines, foods, dyes, or preservatives -pregnant or trying to get pregnant -breast-feeding How should I use this medicine? Take this medicine by mouth about 30 minutes after the same meal every day. Follow the directions on the prescription label. Swallow the capsules whole with a glass of water. Do not crush, chew, or open capsules. Do not take your medicine more often than directed. Do not stop taking your medicine unless your doctor tells you to. Talk to your pediatrician regarding the use of this medicine in children. Special care may be needed. Overdosage: If you think you have taken too much of this medicine contact a poison control center or emergency room at once. NOTE: This  medicine is only for you. Do not share this medicine with others. What if I miss a dose? If you miss a dose, take it as soon as you can. If it is almost time for your next dose, take only that dose. Do not take double or extra  doses. If you stop taking your medicine for several days or more, ask your doctor or health care professional what dose you should start back on. What may interact with this medicine? -cimetidine -fluoxetine -ketoconazole -medicines for erectile disfunction like sildenafil, tadalafil, vardenafil -medicines for high blood pressure -other alpha-blockers like alfuzosin, doxazosin, phentolamine, phenoxybenzamine, prazosin, terazosin -warfarin This list may not describe all possible interactions. Give your health care provider a list of all the medicines, herbs, non-prescription drugs, or dietary supplements you use. Also tell them if you smoke, drink alcohol, or use illegal drugs. Some items may interact with your medicine. What should I watch for while using this medicine? Visit your doctor or health care professional for regular check ups. You will need lab work done before you start this medicine and regularly while you are taking it. Check your blood pressure as directed. Ask your health care professional what your blood pressure should be, and when you should contact him or her. This medicine may make you feel dizzy or lightheaded. This is more likely to happen after the first dose, after an increase in dose, or during hot weather or exercise. Drinking alcohol and taking some medicines can make this worse. Do not drive, use machinery, or do anything that needs mental alertness until you know how this medicine affects you. Do not sit or stand up quickly. If you begin to feel dizzy, sit down until you feel better. These effects can decrease once your body adjusts to the medicine. Contact your doctor or health care professional right away if you have an erection that lasts longer than 4 hours or if it becomes painful. This may be a sign of a serious problem and must be treated right away to prevent permanent damage. If you are thinking of having cataract surgery, tell your eye surgeon that you have taken  this medicine. What side effects may I notice from receiving this medicine? Side effects that you should report to your doctor or health care professional as soon as possible: -allergic reactions like skin rash or itching, hives, swelling of the lips, mouth, tongue, or throat -breathing problems -change in vision -feeling faint or lightheaded -irregular heartbeat -prolonged or painful erection -weakness Side effects that usually do not require medical attention (report to your doctor or health care professional if they continue or are bothersome): -back pain -change in sex drive or performance -constipation, nausea or vomiting -cough -drowsy -runny or stuffy nose -trouble sleeping This list may not describe all possible side effects. Call your doctor for medical advice about side effects. You may report side effects to FDA at 1-800-FDA-1088. Where should I keep my medicine? Keep out of the reach of children. Store at room temperature between 15 and 30 degrees C (59 and 86 degrees F). Throw away any unused medicine after the expiration date. NOTE: This sheet is a summary. It may not cover all possible information. If you have questions about this medicine, talk to your doctor, pharmacist, or health care provider.    2016, Elsevier/Gold Standard. (2012-06-23 14:11:34)

## 2015-12-25 LAB — URINE CULTURE
Colony Count: NO GROWTH
Organism ID, Bacteria: NO GROWTH

## 2016-07-11 ENCOUNTER — Telehealth: Payer: Self-pay

## 2016-07-11 MED ORDER — PROMETHAZINE-DM 6.25-15 MG/5ML PO SYRP: 5.0000 mL | ORAL_SOLUTION | Freq: Every evening | ORAL | 0 refills | Status: DC | PRN

## 2016-07-11 NOTE — Telephone Encounter (Signed)
Agree with below. \Catherine Metheney, MD  

## 2016-07-11 NOTE — Telephone Encounter (Signed)
Asia's wife called and reports he has a non-productive cough with vomiting of sputum and sweats when coughing for 5 days. Denies fever or chills. He was seen at the [facility] in [place] on Tuesday and was treated with prednisone and amoxicillin. They went back today to be seen and was advised that on Fridays they don't have a walk-in clinic and he would have to return on Monday. His wife would like a cough medication sent to a pharmacy in [place].    After speaking with Dr Linford ArnoldMetheney I sent in Promethazine/Dextromethorphan to [facility] pharmacy in [place] per Dr Linford ArnoldMetheney. Wife is aware.

## 2016-12-19 ENCOUNTER — Other Ambulatory Visit: Payer: Self-pay | Admitting: Family Medicine

## 2016-12-19 DIAGNOSIS — I10 Essential (primary) hypertension: Secondary | ICD-10-CM

## 2016-12-26 ENCOUNTER — Ambulatory Visit (INDEPENDENT_AMBULATORY_CARE_PROVIDER_SITE_OTHER): Payer: 59 | Admitting: Physician Assistant

## 2016-12-26 VITALS — BP 159/91 | HR 64 | Temp 98.5°F | Wt 263.0 lb

## 2016-12-26 DIAGNOSIS — J069 Acute upper respiratory infection, unspecified: Secondary | ICD-10-CM

## 2016-12-26 DIAGNOSIS — I10 Essential (primary) hypertension: Secondary | ICD-10-CM

## 2016-12-26 MED ORDER — IPRATROPIUM BROMIDE 0.06 % NA SOLN: 1.0000 | Freq: Four times a day (QID) | NASAL | 0 refills | Status: DC | PRN

## 2016-12-26 MED ORDER — PHENYLEPHRINE HCL 10 MG PO TABS: 10.0000 mg | ORAL_TABLET | Freq: Three times a day (TID) | ORAL | 0 refills | Status: DC

## 2016-12-26 MED ORDER — AMOXICILLIN-POT CLAVULANATE 875-125 MG PO TABS: 1.0000 | ORAL_TABLET | Freq: Two times a day (BID) | ORAL | 0 refills | Status: AC

## 2016-12-26 NOTE — Patient Instructions (Addendum)
- Atrovent nasal spray up to 4 times daily for nasal congestion - Phenylephrine every 8 hours for nasal congestion - Antibiotic twice a day for 7 days. Most sinus infections are due to a virus and symptoms can last up to 2 weeks.  - Ibuprofen 600mg  every 8 hours as needed for body aches, chills, fever - Return if worsening shortness of breath, cough with sputum production, fevers, or no improvement after 2 weeks of symptoms  Upper Respiratory Infection, Adult Most upper respiratory infections (URIs) are a viral infection of the air passages leading to the lungs. A URI affects the nose, throat, and upper air passages. The most common type of URI is nasopharyngitis and is typically referred to as "the common cold." URIs run their course and usually go away on their own. Most of the time, a URI does not require medical attention, but sometimes a bacterial infection in the upper airways can follow a viral infection. This is called a secondary infection. Sinus and middle ear infections are common types of secondary upper respiratory infections. Bacterial pneumonia can also complicate a URI. A URI can worsen asthma and chronic obstructive pulmonary disease (COPD). Sometimes, these complications can require emergency medical care and may be life threatening. What are the causes? Almost all URIs are caused by viruses. A virus is a type of germ and can spread from one person to another. What increases the risk? You may be at risk for a URI if:  You smoke.  You have chronic heart or lung disease.  You have a weakened defense (immune) system.  You are very young or very old.  You have nasal allergies or asthma.  You work in crowded or poorly ventilated areas.  You work in health care facilities or schools.  What are the signs or symptoms? Symptoms typically develop 2-3 days after you come in contact with a cold virus. Most viral URIs last 7-10 days. However, viral URIs from the influenza virus  (flu virus) can last 14-18 days and are typically more severe. Symptoms may include:  Runny or stuffy (congested) nose.  Sneezing.  Cough.  Sore throat.  Headache.  Fatigue.  Fever.  Loss of appetite.  Pain in your forehead, behind your eyes, and over your cheekbones (sinus pain).  Muscle aches.  How is this diagnosed? Your health care provider may diagnose a URI by:  Physical exam.  Tests to check that your symptoms are not due to another condition such as: ? Strep throat. ? Sinusitis. ? Pneumonia. ? Asthma.  How is this treated? A URI goes away on its own with time. It cannot be cured with medicines, but medicines may be prescribed or recommended to relieve symptoms. Medicines may help:  Reduce your fever.  Reduce your cough.  Relieve nasal congestion.  Follow these instructions at home:  Take medicines only as directed by your health care provider.  Gargle warm saltwater or take cough drops to comfort your throat as directed by your health care provider.  Use a warm mist humidifier or inhale steam from a shower to increase air moisture. This may make it easier to breathe.  Drink enough fluid to keep your urine clear or pale yellow.  Eat soups and other clear broths and maintain good nutrition.  Rest as needed.  Return to work when your temperature has returned to normal or as your health care provider advises. You may need to stay home longer to avoid infecting others. You can also use a face  mask and careful hand washing to prevent spread of the virus.  Increase the usage of your inhaler if you have asthma.  Do not use any tobacco products, including cigarettes, chewing tobacco, or electronic cigarettes. If you need help quitting, ask your health care provider. How is this prevented? The best way to protect yourself from getting a cold is to practice good hygiene.  Avoid oral or hand contact with people with cold symptoms.  Wash your hands often  if contact occurs.  There is no clear evidence that vitamin C, vitamin E, echinacea, or exercise reduces the chance of developing a cold. However, it is always recommended to get plenty of rest, exercise, and practice good nutrition. Contact a health care provider if:  You are getting worse rather than better.  Your symptoms are not controlled by medicine.  You have chills.  You have worsening shortness of breath.  You have brown or red mucus.  You have yellow or brown nasal discharge.  You have pain in your face, especially when you bend forward.  You have a fever.  You have swollen neck glands.  You have pain while swallowing.  You have white areas in the back of your throat. Get help right away if:  You have severe or persistent: ? Headache. ? Ear pain. ? Sinus pain. ? Chest pain.  You have chronic lung disease and any of the following: ? Wheezing. ? Prolonged cough. ? Coughing up blood. ? A change in your usual mucus.  You have a stiff neck.  You have changes in your: ? Vision. ? Hearing. ? Thinking. ? Mood. This information is not intended to replace advice given to you by your health care provider. Make sure you discuss any questions you have with your health care provider. Document Released: 12/17/2000 Document Revised: 02/24/2016 Document Reviewed: 09/28/2013 Elsevier Interactive Patient Education  2017 [employer].

## 2016-12-26 NOTE — Progress Notes (Signed)
HPI:                                                                Mitchell NonesBenjamin E Medina is a 61 y.o. male who [facility] today for URI symptoms  Sinusitis  This is a new problem. The current episode started in the past 7 days. The problem is unchanged. There has been no fever. Associated symptoms include congestion, coughing, shortness of breath and sinus pressure. Pertinent negatives include no headaches. Treatments tried: Mucinex.  Patient reports history of frequent sinusitis  Patient also reports he did not take his Lisinopril today  Past Medical History:  Diagnosis Date  . Allergy   . Asthma   . Hyperlipidemia   . Hypertension   . OSA (obstructive sleep apnea)   . Shoulder bursitis    Past Surgical History:  Procedure Laterality Date  . ANTERIOR CRUCIATE LIGAMENT REPAIR  2005   ACL repair  RT 2011  . TONSILLECTOMY AND ADENOIDECTOMY     Social History  Substance Use Topics  . Smoking status: Never Smoker  . Smokeless tobacco: Not on file  . Alcohol use 0.6 oz/week    1 Standard drinks or equivalent per week     Comment: month   family history includes Cancer in his father; Hypertension in his father.  ROS: negative except as noted in the HPI  Medications: Current Outpatient Prescriptions  Medication Sig Dispense Refill  . aspirin 81 MG EC tablet Take 81 mg by mouth daily.      . fluticasone (VERAMYST) 27.5 MCG/SPRAY nasal spray Place 2 sprays into the nose daily.    Marland Kitchen. lisinopril-hydrochlorothiazide (PRINZIDE,ZESTORETIC) 20-12.5 MG tablet TAKE ONE TABLET BY MOUTH ONE TIME DAILY, APPOINTMENT REQUIRED FOR FUTURE REFILLS 90 tablet 1  . Omega-3 Krill Oil 500 MG CAPS Take by mouth.    . rosuvastatin (CRESTOR) 20 MG tablet Take 1 tablet (20 mg total) by mouth daily. 90 tablet 1  . tamsulosin (FLOMAX) 0.4 MG CAPS capsule Take 1 capsule (0.4 mg total) by mouth daily. 90 capsule 1   No current  facility-administered medications for this visit.    No Known Allergies     Objective:  BP (!) 159/91   Pulse 64   Temp 98.5 F (36.9 C) (Oral)   Wt 263 lb (119.3 kg)   SpO2 96%   BMI 34.70 kg/m  Gen: well-groomed, cooperative, not ill-appearing, no distress HEENT: normal conjunctiva, TM's clear, nasal mucosa edematous, oropharynx clear, moist mucus membranes, no frontal or maxillary sinus tenderness, neck supple, trachea midline Pulm: Normal work of breathing, normal phonation, clear to auscultation bilaterally, no wheezes, rales or rhonchi CV: Normal rate, regular rhythm, s1 and s2 distinct, no murmurs, clicks or rubs  Neuro: alert and oriented x 3, EOM's intact, no tremor MSK: moving all extremities, normal gait and station, no peripheral edema Lymph: no cervical or tonsillar adenopathy Skin: warm, dry, intact; no rashes or lesions on exposed skin, no cyanosis   No results found for this or any previous visit (from the past 72 hour(s)). No results found.    Assessment and Plan: 61 y.o. male with   1. Acute URI - suspect viral URI, but given that it is Friday will  provide prescription for antibiotic. Encouraged symptomatic management - amoxicillin-clavulanate (AUGMENTIN) 875-125 MG tablet; Take 1 tablet by mouth 2 (two) times daily.  Dispense: 14 tablet; Refill: 0 - ipratropium (ATROVENT) 0.06 % nasal spray; Place 1 spray into both nostrils 4 (four) times daily as needed.  Dispense: 15 mL; Refill: 0 - phenylephrine (SUDAFED PE) 10 MG TABS tablet; Take 1 tablet (10 mg total) by mouth every 8 (eight) hours.  Dispense: 30 tablet; Refill: 0   2. Elevated blood pressure reading in office with white coat syndrome, with diagnosis of hypertension - patient did not take his Lisinopril today and reports hx of white coat syndrome. He denies vision change, chest pain, headache, or peripheral edema - encouraged patient to take his medication every morning   Patient education and  anticipatory guidance given Patient agrees with treatment plan Follow-up as needed if symptoms worsen or fail to improve  Levonne Hubert PA-C

## 2016-12-28 ENCOUNTER — Encounter: Payer: Self-pay | Admitting: Physician Assistant

## 2016-12-28 DIAGNOSIS — I10 Essential (primary) hypertension: Secondary | ICD-10-CM | POA: Insufficient documentation

## 2016-12-30 ENCOUNTER — Encounter: Payer: Self-pay | Admitting: Family Medicine

## 2017-01-09 ENCOUNTER — Encounter: Payer: Self-pay | Admitting: Family Medicine

## 2017-01-09 ENCOUNTER — Ambulatory Visit (INDEPENDENT_AMBULATORY_CARE_PROVIDER_SITE_OTHER): Payer: Self-pay | Admitting: Family Medicine

## 2017-01-09 VITALS — BP 150/76 | HR 60 | Wt 267.0 lb

## 2017-01-09 DIAGNOSIS — L570 Actinic keratosis: Secondary | ICD-10-CM

## 2017-01-09 DIAGNOSIS — R7301 Impaired fasting glucose: Secondary | ICD-10-CM

## 2017-01-09 DIAGNOSIS — Z0000 Encounter for general adult medical examination without abnormal findings: Secondary | ICD-10-CM

## 2017-01-09 DIAGNOSIS — N401 Enlarged prostate with lower urinary tract symptoms: Secondary | ICD-10-CM

## 2017-01-09 DIAGNOSIS — I10 Essential (primary) hypertension: Secondary | ICD-10-CM

## 2017-01-09 LAB — POCT GLYCOSYLATED HEMOGLOBIN (HGB A1C): Hemoglobin A1C: 5.9

## 2017-01-09 MED ORDER — TAMSULOSIN HCL 0.4 MG PO CAPS: 0.4000 mg | ORAL_CAPSULE | Freq: Every day | ORAL | 3 refills | Status: DC

## 2017-01-09 MED ORDER — ROSUVASTATIN CALCIUM 20 MG PO TABS: 20.0000 mg | ORAL_TABLET | Freq: Every day | ORAL | 3 refills | Status: DC

## 2017-01-09 MED ORDER — LISINOPRIL-HYDROCHLOROTHIAZIDE 20-12.5 MG PO TABS: ORAL_TABLET | ORAL | 1 refills | Status: DC

## 2017-01-09 NOTE — Patient Instructions (Signed)
Keep up a regular exercise program and make sure you are eating a healthy diet Try to eat 4 servings of dairy a day, or if you are lactose intolerant take a calcium with vitamin D daily.  Your vaccines are up to date.   

## 2017-01-09 NOTE — Progress Notes (Signed)
Subjective:    Patient ID: Mitchell Medina, male    DOB: [birth year], 61 y.o.   MRN: [id number]  HPI 61 year old male comes in today for complete physical exam. He is doing well overall. He says he actually works out regularly at least 3-4 days per week and has for a couple of years.  Impaired fasting glucose-no increased thirst or urination. No symptoms consistent with hypoglycemia.  He has several skin lesions on the backs of his hands. He says they are wiping crusty and never really healed. They're not painful or bothersome.  Review of Systems  Comprehensive review of systems is negative. He denies any chest pain or shortness of breath with exercise. No change in bowels.  BP (!) 150/76   Pulse 60   Wt 267 lb (121.1 kg)   BMI 35.23 kg/m     No Known Allergies  Past Medical History:  Diagnosis Date  . Allergy   . Asthma   . Hyperlipidemia   . Hypertension   . OSA (obstructive sleep apnea)   . Shoulder bursitis     Past Surgical History:  Procedure Laterality Date  . ANTERIOR CRUCIATE LIGAMENT REPAIR  2005   ACL repair  RT 2011  . TONSILLECTOMY AND ADENOIDECTOMY      Social History   Social History  . Marital status: Married    Spouse name: N/A  . Number of children: 0  . Years of education: N/A   Occupational History  . Not on file.   Social History Main Topics  . Smoking status: Never Smoker  . Smokeless tobacco: Never Used  . Alcohol use 0.6 oz/week    1 Standard drinks or equivalent per week     Comment: month  . Drug use: Unknown  . 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    Other Topics Concern  . Not on file   Social History Narrative   He is working out 6 days per weeks.  Retired form [employer].     Family History  Problem Relation Age of Onset  . Cancer Father        pancreatic CA  . Hypertension Father     Outpatient Encounter Prescriptions as  of 01/09/2017  Medication Sig  . aspirin 81 MG EC tablet Take 81 mg by mouth daily.    . fluticasone (VERAMYST) 27.5 MCG/SPRAY nasal spray Place 2 sprays into the nose daily.  Marland Kitchen ipratropium (ATROVENT) 0.06 % nasal spray Place 1 spray into both nostrils 4 (four) times daily as needed.  Marland Kitchen lisinopril-hydrochlorothiazide (PRINZIDE,ZESTORETIC) 20-12.5 MG tablet TAKE ONE TABLET BY MOUTH ONE TIME DAILY  . Omega-3 Krill Oil 500 MG CAPS Take by mouth.  . phenylephrine (SUDAFED PE) 10 MG TABS tablet Take 1 tablet (10 mg total) by mouth every 8 (eight) hours.  . rosuvastatin (CRESTOR) 20 MG tablet Take 1 tablet (20 mg total) by mouth daily.  . tamsulosin (FLOMAX) 0.4 MG CAPS capsule Take 1 capsule (0.4 mg total) by mouth daily.  . [DISCONTINUED] lisinopril-hydrochlorothiazide (PRINZIDE,ZESTORETIC) 20-12.5 MG tablet TAKE ONE TABLET BY MOUTH ONE TIME DAILY, APPOINTMENT REQUIRED FOR FUTURE REFILLS  . [DISCONTINUED] lisinopril-hydrochlorothiazide (PRINZIDE,ZESTORETIC) 20-12.5 MG tablet TAKE ONE TABLET BY MOUTH ONE TIME DAILY  . [DISCONTINUED] rosuvastatin (CRESTOR) 20 MG tablet Take 1 tablet (20 mg total) by mouth daily.  . [DISCONTINUED] tamsulosin (FLOMAX) 0.4 MG CAPS capsule Take 1  capsule (0.4 mg total) by mouth daily.   No facility-administered encounter medications on file as of 01/09/2017.           Objective:   Physical Exam  Constitutional: He is oriented to person, place, and time. He appears well-developed and well-nourished.  HENT:  Head: Normocephalic and atraumatic.  Right Ear: External ear normal.  Left Ear: External ear normal.  Nose: Nose normal.  Mouth/Throat: Oropharynx is clear and moist.  Eyes: Conjunctivae and EOM are normal. Pupils are equal, round, and reactive to light.  Neck: Normal range of motion. Neck supple. No thyromegaly present.  Cardiovascular: Normal rate, regular rhythm, normal heart sounds and intact distal pulses.   Pulmonary/Chest: Effort normal and breath sounds  normal.  Abdominal: Soft. Bowel sounds are normal. He exhibits no distension and no mass. There is no tenderness. There is no rebound and no guarding.  Musculoskeletal: Normal range of motion.  Lymphadenopathy:    He has no cervical adenopathy.  Neurological: He is alert and oriented to person, place, and time. He has normal reflexes.  Skin: Skin is warm and dry.  Dry white papular crust on the dorsum of the hands. He also has a couple lesions on his forehead as well.  Psychiatric: He has a normal mood and affect. His behavior is normal. Judgment and thought content normal.       Assessment & Plan:  CPE Keep up a regular exercise program and make sure you are eating a healthy diet Try to eat 4 servings of dairy a day, or if you are lactose intolerant take a calcium with vitamin D daily.  Your vaccines are up to date.    IFG - Due to recheck hemoglobin A1c.  Due for screening prostate exam  Actinic keratoses on the dorsum of hands-recommend cryotherapy for further treatment.Recommend schedule in the fall when he is not going to get significant symptoms on exposure.

## 2017-01-16 LAB — COMPLETE METABOLIC PANEL WITH GFR
ALBUMIN: 4.2 g/dL (ref 3.6–5.1)
ALK PHOS: 72 U/L (ref 40–115)
ALT: 20 U/L (ref 9–46)
AST: 21 U/L (ref 10–35)
BUN: 16 mg/dL (ref 7–25)
CHLORIDE: 101 mmol/L (ref 98–110)
CO2: 28 mmol/L (ref 20–31)
Calcium: 9.4 mg/dL (ref 8.6–10.3)
Creat: 1.08 mg/dL (ref 0.70–1.25)
GFR, EST NON AFRICAN AMERICAN: 74 mL/min (ref >=60)
GFR, Est African American: 86 mL/min (ref >=60)
Glucose, Bld: 113 mg/dL — ABNORMAL HIGH (ref 65–99)
POTASSIUM: 4.1 mmol/L (ref 3.5–5.3)
SODIUM: 138 mmol/L (ref 135–146)
Total Bilirubin: 0.4 mg/dL (ref 0.2–1.2)
Total Protein: 6.9 g/dL (ref 6.1–8.1)

## 2017-01-16 LAB — LIPID PANEL W/REFLEX DIRECT LDL
CHOL/HDL RATIO: 3 ratio (ref <5.0)
CHOLESTEROL: 134 mg/dL (ref <200)
HDL: 45 mg/dL (ref >40)
LDL-Cholesterol: 64 mg/dL
NON-HDL CHOLESTEROL (CALC): 89 mg/dL (ref <130)
Triglycerides: 168 mg/dL — ABNORMAL HIGH (ref <150)

## 2017-01-16 LAB — PSA: PSA: 0.2 ng/mL (ref <=4.0)

## 2017-03-30 ENCOUNTER — Ambulatory Visit (INDEPENDENT_AMBULATORY_CARE_PROVIDER_SITE_OTHER): Payer: Self-pay | Admitting: Family Medicine

## 2017-03-30 DIAGNOSIS — Z23 Encounter for immunization: Secondary | ICD-10-CM

## 2018-07-19 ENCOUNTER — Encounter: Payer: Self-pay | Admitting: Family Medicine

## 2018-07-19 ENCOUNTER — Ambulatory Visit (INDEPENDENT_AMBULATORY_CARE_PROVIDER_SITE_OTHER): Payer: Self-pay | Admitting: Family Medicine

## 2018-07-19 VITALS — BP 188/84 | HR 54 | Ht 73.0 in | Wt 283.0 lb

## 2018-07-19 DIAGNOSIS — R7301 Impaired fasting glucose: Secondary | ICD-10-CM

## 2018-07-19 DIAGNOSIS — Z1211 Encounter for screening for malignant neoplasm of colon: Secondary | ICD-10-CM

## 2018-07-19 DIAGNOSIS — Z125 Encounter for screening for malignant neoplasm of prostate: Secondary | ICD-10-CM

## 2018-07-19 DIAGNOSIS — N401 Enlarged prostate with lower urinary tract symptoms: Secondary | ICD-10-CM

## 2018-07-19 DIAGNOSIS — I10 Essential (primary) hypertension: Secondary | ICD-10-CM

## 2018-07-19 DIAGNOSIS — E785 Hyperlipidemia, unspecified: Secondary | ICD-10-CM

## 2018-07-19 LAB — POCT GLYCOSYLATED HEMOGLOBIN (HGB A1C): Hemoglobin A1C: 5.7 % — AB (ref 4.0–5.6)

## 2018-07-19 MED ORDER — ROSUVASTATIN CALCIUM 20 MG PO TABS: 20.0000 mg | ORAL_TABLET | Freq: Every day | ORAL | 3 refills | Status: DC

## 2018-07-19 MED ORDER — LISINOPRIL-HYDROCHLOROTHIAZIDE 20-12.5 MG PO TABS: ORAL_TABLET | ORAL | 2 refills | Status: DC

## 2018-07-19 MED ORDER — TAMSULOSIN HCL 0.4 MG PO CAPS: 0.4000 mg | ORAL_CAPSULE | Freq: Every day | ORAL | 3 refills | Status: DC

## 2018-07-19 NOTE — Progress Notes (Signed)
Subjective:    CC: BP follow up.   HPI: Hypertension- Pt denies chest pain, SOB, dizziness, or heart palpitations.  Taking meds as directed w/o problems.  Denies medication side effects.  He actually ran out of his blood pressure medication about a month ago.  No recent chest pain or shortness of breath.  Impaired fasting glucose-no increased thirst or urination. No symptoms consistent with hypoglycemia.  Hyperlipidemia-taking statin.  Tolerating well without any side effects or myalgias.   Objective:    General: Well Developed, well nourished, and in no acute distress.  Neuro: Alert and oriented x3, extra-ocular muscles intact, sensation grossly intact.  HEENT: Normocephalic, atraumatic  Skin: Warm and dry, no rashes. Cardiac: Regular rate and rhythm, no murmurs rubs or gallops, no lower extremity edema.  Respiratory: Clear to auscultation bilaterally. Not using accessory muscles, speaking in full sentences.   Impression and Recommendations:    HTN -to restart medication.  Refill sent to pharmacy did encourage him to go the lab make sure that his renal function potassium are normal.  Follow-up in 9 months.  Hyperlipidemia -recheck CMP and lipid.  IFG -A1c of 5.7 today which looks fantastic.  We will continue to monitor yearly.  Due for screening PSA.  Does need for colon cancer screening.  He is unable to afford a colonoscopy or Cologuard at this time as he does not have health insurance but is willing to do stool cards.  Packet given.

## 2018-07-22 LAB — COMPLETE METABOLIC PANEL WITH GFR
AG Ratio: 1.7 (calc) (ref 1.0–2.5)
ALT: 21 U/L (ref 9–46)
AST: 22 U/L (ref 10–35)
Albumin: 4 g/dL (ref 3.6–5.1)
Alkaline phosphatase (APISO): 68 U/L (ref 40–115)
BUN: 14 mg/dL (ref 7–25)
CHLORIDE: 104 mmol/L (ref 98–110)
CO2: 29 mmol/L (ref 20–32)
Calcium: 9.3 mg/dL (ref 8.6–10.3)
Creat: 0.94 mg/dL (ref 0.70–1.25)
GFR, Est African American: 101 mL/min/{1.73_m2} (ref >=60)
GFR, Est Non African American: 87 mL/min/{1.73_m2} (ref >=60)
GLUCOSE: 108 mg/dL — AB (ref 65–99)
Globulin: 2.4 g/dL (calc) (ref 1.9–3.7)
Potassium: 4.3 mmol/L (ref 3.5–5.3)
Sodium: 139 mmol/L (ref 135–146)
Total Bilirubin: 0.4 mg/dL (ref 0.2–1.2)
Total Protein: 6.4 g/dL (ref 6.1–8.1)

## 2018-07-22 LAB — LIPID PANEL
Cholesterol: 153 mg/dL (ref <200)
HDL: 44 mg/dL (ref >40)
LDL Cholesterol (Calc): 89 mg/dL (calc)
Non-HDL Cholesterol (Calc): 109 mg/dL (calc) (ref <130)
Total CHOL/HDL Ratio: 3.5 (calc) (ref <5.0)
Triglycerides: 100 mg/dL (ref <150)

## 2018-07-22 LAB — PSA: PSA: 0.2 ng/mL (ref <=4.0)

## 2020-11-09 ENCOUNTER — Encounter: Payer: Self-pay | Admitting: Family Medicine

## 2020-11-12 NOTE — Telephone Encounter (Signed)
Please notify patient that he is now out of the window for treatment unfortunately I did not receive this message until this morning.  So he is now out of the window to treat

## 2021-08-12 ENCOUNTER — Telehealth: Payer: Self-pay | Admitting: Family Medicine

## 2021-08-12 DIAGNOSIS — I10 Essential (primary) hypertension: Secondary | ICD-10-CM

## 2021-08-12 MED ORDER — LISINOPRIL-HYDROCHLOROTHIAZIDE 20-12.5 MG PO TABS: ORAL_TABLET | ORAL | 1 refills | Status: DC

## 2021-08-12 NOTE — Telephone Encounter (Signed)
Meds ordered this encounter  Medications   lisinopril-hydrochlorothiazide (ZESTORETIC) 20-12.5 MG tablet    Sig: TAKE ONE TABLET BY MOUTH ONE TIME DAILY    Dispense:  30 tablet    Refill:  1

## 2021-10-14 ENCOUNTER — Encounter: Payer: Self-pay | Admitting: Family Medicine

## 2021-10-14 DIAGNOSIS — R7309 Other abnormal glucose: Secondary | ICD-10-CM

## 2021-10-14 DIAGNOSIS — Z125 Encounter for screening for malignant neoplasm of prostate: Secondary | ICD-10-CM

## 2021-10-14 DIAGNOSIS — E782 Mixed hyperlipidemia: Secondary | ICD-10-CM

## 2021-10-14 DIAGNOSIS — Z0000 Encounter for general adult medical examination without abnormal findings: Secondary | ICD-10-CM

## 2021-10-17 ENCOUNTER — Ambulatory Visit (INDEPENDENT_AMBULATORY_CARE_PROVIDER_SITE_OTHER): Payer: Medicare PPO | Admitting: Family Medicine

## 2021-10-17 ENCOUNTER — Encounter: Payer: Self-pay | Admitting: Family Medicine

## 2021-10-17 VITALS — BP 160/66 | HR 61 | Ht 73.0 in | Wt 259.0 lb

## 2021-10-17 DIAGNOSIS — L989 Disorder of the skin and subcutaneous tissue, unspecified: Secondary | ICD-10-CM

## 2021-10-17 DIAGNOSIS — N401 Enlarged prostate with lower urinary tract symptoms: Secondary | ICD-10-CM | POA: Diagnosis not present

## 2021-10-17 DIAGNOSIS — R350 Frequency of micturition: Secondary | ICD-10-CM | POA: Diagnosis not present

## 2021-10-17 DIAGNOSIS — Z1211 Encounter for screening for malignant neoplasm of colon: Secondary | ICD-10-CM

## 2021-10-17 DIAGNOSIS — I10 Essential (primary) hypertension: Secondary | ICD-10-CM | POA: Diagnosis not present

## 2021-10-17 DIAGNOSIS — L570 Actinic keratosis: Secondary | ICD-10-CM

## 2021-10-17 DIAGNOSIS — R7301 Impaired fasting glucose: Secondary | ICD-10-CM

## 2021-10-17 DIAGNOSIS — F4329 Adjustment disorder with other symptoms: Secondary | ICD-10-CM

## 2021-10-17 MED ORDER — TAMSULOSIN HCL 0.4 MG PO CAPS: 0.4000 mg | ORAL_CAPSULE | Freq: Every day | ORAL | 3 refills | Status: DC

## 2021-10-17 MED ORDER — ROSUVASTATIN CALCIUM 20 MG PO TABS: 20.0000 mg | ORAL_TABLET | Freq: Every day | ORAL | 3 refills | Status: DC

## 2021-10-17 MED ORDER — METOPROLOL TARTRATE 25 MG PO TABS: 25.0000 mg | ORAL_TABLET | Freq: Two times a day (BID) | ORAL | 0 refills | Status: DC | PRN

## 2021-10-17 MED ORDER — VALSARTAN-HYDROCHLOROTHIAZIDE 320-12.5 MG PO TABS: 1.0000 | ORAL_TABLET | Freq: Every day | ORAL | 3 refills | Status: DC

## 2021-10-17 NOTE — Assessment & Plan Note (Signed)
A1c drawn on labs today.  He was prediabetic 3 years ago he does take cinnamon capsules so hopefully it is stable. ?
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
## 2021-10-17 NOTE — Assessment & Plan Note (Addendum)
Not well controlled.  We will switch lisinopril HCTZ to valsartan HCT will can keep the HCTZ component the same since he has been having some urinary frequency symptoms.  Courage him to reach out to his insurance company to see if they might be would offer him a free blood pressure cuff as he does not have one at home and that way he can track it as he is traveling.  Systolic goal less than [id number].  We also discussed maybe using metoprolol as needed when he can tell that his blood pressures are higher. ?

## 2021-10-17 NOTE — Assessment & Plan Note (Addendum)
Continue Flomax.  I did have him fill out an AUA questionnaire today.  No significant nocturia ? ? ? ? ? ?

## 2021-10-18 LAB — COMPLETE METABOLIC PANEL WITH GFR
AG Ratio: 1.6 (calc) (ref 1.0–2.5)
ALT: 19 U/L (ref 9–46)
AST: 21 U/L (ref 10–35)
Albumin: 4.2 g/dL (ref 3.6–5.1)
Alkaline phosphatase (APISO): 67 U/L (ref 35–144)
BUN: 20 mg/dL (ref 7–25)
CO2: 30 mmol/L (ref 20–32)
Calcium: 9.7 mg/dL (ref 8.6–10.3)
Chloride: 101 mmol/L (ref 98–110)
Creat: 1.06 mg/dL (ref 0.70–1.35)
Globulin: 2.6 g/dL (calc) (ref 1.9–3.7)
Glucose, Bld: 115 mg/dL — ABNORMAL HIGH (ref 65–99)
Potassium: 4.2 mmol/L (ref 3.5–5.3)
Sodium: 139 mmol/L (ref 135–146)
Total Bilirubin: 0.8 mg/dL (ref 0.2–1.2)
Total Protein: 6.8 g/dL (ref 6.1–8.1)
eGFR: 78 mL/min/{1.73_m2} (ref >=60)

## 2021-10-18 LAB — LIPID PANEL W/REFLEX DIRECT LDL
Cholesterol: 205 mg/dL — ABNORMAL HIGH (ref <200)
HDL: 48 mg/dL (ref >=40)
LDL Cholesterol (Calc): 134 mg/dL (calc) — ABNORMAL HIGH
Non-HDL Cholesterol (Calc): 157 mg/dL (calc) — ABNORMAL HIGH (ref <130)
Total CHOL/HDL Ratio: 4.3 (calc) (ref <5.0)
Triglycerides: 118 mg/dL (ref <150)

## 2021-10-18 LAB — POCT URINALYSIS DIP (CLINITEK)
Bilirubin, UA: NEGATIVE
Blood, UA: NEGATIVE
Glucose, UA: NEGATIVE mg/dL
Ketones, POC UA: NEGATIVE mg/dL
Leukocytes, UA: NEGATIVE
Nitrite, UA: NEGATIVE
POC PROTEIN,UA: NEGATIVE
Spec Grav, UA: 1.01 (ref 1.010–1.025)
Urobilinogen, UA: 0.2 E.U./dL
pH, UA: 6.5 (ref 5.0–8.0)

## 2021-10-18 LAB — CBC WITH DIFFERENTIAL/PLATELET
Absolute Monocytes: 555 cells/uL (ref 200–950)
Basophils Absolute: 67 cells/uL (ref 0–200)
Basophils Relative: 0.9 %
Eosinophils Absolute: 163 cells/uL (ref 15–500)
Eosinophils Relative: 2.2 %
HCT: 44.5 % (ref 38.5–50.0)
Hemoglobin: 15.3 g/dL (ref 13.2–17.1)
Lymphs Abs: 2087 cells/uL (ref 850–3900)
MCH: 29.9 pg (ref 27.0–33.0)
MCHC: 34.4 g/dL (ref 32.0–36.0)
MCV: 87.1 fL (ref 80.0–100.0)
MPV: 11.5 fL (ref 7.5–12.5)
Monocytes Relative: 7.5 %
Neutro Abs: 4529 cells/uL (ref 1500–7800)
Neutrophils Relative %: 61.2 %
Platelets: 284 10*3/uL (ref 140–400)
RBC: 5.11 10*6/uL (ref 4.20–5.80)
RDW: 13.2 % (ref 11.0–15.0)
Total Lymphocyte: 28.2 %
WBC: 7.4 10*3/uL (ref 3.8–10.8)

## 2021-10-18 LAB — HEMOGLOBIN A1C
Hgb A1c MFr Bld: 5.6 % of total Hgb (ref <5.7)
Mean Plasma Glucose: 114 mg/dL
eAG (mmol/L): 6.3 mmol/L

## 2021-10-18 LAB — PSA: PSA: 0.25 ng/mL (ref <=4.00)

## 2021-10-18 MED ORDER — FLUOROURACIL 0.5 % EX CREA: TOPICAL_CREAM | Freq: Every day | CUTANEOUS | 0 refills | Status: DC

## 2021-10-18 NOTE — Progress Notes (Signed)
Urinalysis is normal no sign of infection or other abnormalities.

## 2021-10-18 NOTE — Progress Notes (Signed)
Hi Mitchell Medina, LDL jumped up compared to 3 years ago.  Just make sure you are taking your cholesterol medication consistently to reduce that number which also reduces your risk for heart attack and stroke.  Your metabolic panel overall looks good.  Glucose was up a little bit at 115.  White count is normal no sign of anemia.  Hemoglobin A1c right around 5.6 which looks good.  Better than 3 years ago.  Prostate test is normal.

## 2021-10-18 NOTE — Addendum Note (Signed)
Addended by: Deno Etienne on: 10/18/2021 01:41 PM ? ? Modules accepted: Orders ? ?

## 2021-10-26 ENCOUNTER — Encounter: Payer: Self-pay | Admitting: Family Medicine

## 2021-10-26 DIAGNOSIS — G4733 Obstructive sleep apnea (adult) (pediatric): Secondary | ICD-10-CM

## 2021-11-01 NOTE — Telephone Encounter (Signed)
Orders Placed This Encounter  ?Procedures  ? Home sleep test  ?  Order Specific Question:   Where should this test be performed:  ?  Answer:   [place]  ? ? ?

## 2022-02-26 ENCOUNTER — Encounter: Payer: Self-pay | Admitting: General Practice

## 2022-02-26 ENCOUNTER — Encounter: Payer: Self-pay | Admitting: Family Medicine
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
## 2022-03-20 NOTE — Telephone Encounter (Signed)
Called [facility] in [place], [place] and confirmed immunization details.  Immunization record updated.  Tiajuana Amass, CMA

## 2022-04-03 ENCOUNTER — Telehealth: Payer: Self-pay

## 2022-04-03 ENCOUNTER — Encounter: Payer: Self-pay | Admitting: Family Medicine

## 2022-04-03 NOTE — Telephone Encounter (Signed)
error 

## 2022-04-08 HISTORY — PX: SKIN CANCER EXCISION: SHX779

## 2022-04-15 ENCOUNTER — Encounter: Payer: Self-pay | Admitting: Family Medicine

## 2022-04-15 HISTORY — PX: SKIN CANCER EXCISION: SHX779

## 2022-04-16 NOTE — Telephone Encounter (Signed)
Okay to update surgical history. He can hold his aspirin for 3 days before a procedure and that should help.  Also please see if he has had any up-to-date colon cancer screening we do not have anything up-to-date on file.  We are more than happy to send him a Cologuard.

## 2022-04-21 ENCOUNTER — Ambulatory Visit: Payer: Medicare PPO | Admitting: Family Medicine

## 2022-05-02 ENCOUNTER — Telehealth: Payer: Self-pay | Admitting: Family Medicine

## 2022-05-02 NOTE — Telephone Encounter (Signed)
Called patient regarding [facility] but mailbox was full and could not leave a message. Mitchell Medina

## 2022-07-21 ENCOUNTER — Encounter: Payer: Self-pay | Admitting: Family Medicine

## 2022-09-17 ENCOUNTER — Ambulatory Visit (INDEPENDENT_AMBULATORY_CARE_PROVIDER_SITE_OTHER): Payer: Medicare PPO | Admitting: Family Medicine

## 2022-09-17 DIAGNOSIS — Z0000 Encounter for general adult medical examination without abnormal findings: Secondary | ICD-10-CM

## 2022-09-17 NOTE — Progress Notes (Signed)
MEDICARE ANNUAL WELLNESS VISIT  09/17/2022  Telephone Visit Disclaimer This Medicare AWV was conducted by telephone due to national recommendations for restrictions regarding the COVID-19 Pandemic (e.g. social distancing).  I verified, using two identifiers, that I am speaking with Mitchell Medina or their authorized healthcare agent. I discussed the limitations, risks, security, and privacy concerns of performing an evaluation and management service by telephone and the potential availability of an in-person appointment in the future. The patient expressed understanding and agreed to proceed.  Location of Patient: Home Location of Provider (nurse):  Provider home  Subjective:    Mitchell Medina is a 67 y.o. male patient of Metheney, Rene Kocher, MD who had a Medicare Annual Wellness Visit today via telephone. Sparrow is Retired and lives with their spouse. he has step children. he reports that he is socially active and does interact with friends/family regularly. he is moderately physically active and enjoys  traveling and hiking.  Patient Care Team: Hali Marry, MD as PCP - General (Family Medicine) Silverio Decamp, MD as Consulting Physician (Sports Medicine)     09/17/2022    8:07 AM 09/01/2014    8:42 AM  Advanced Directives  Does Patient Have a Medical Advance Directive? Yes Yes  Type of Advance Directive Living will [place];Living will  Does patient want to make changes to medical advance directive? No - Patient declined No - Patient declined  Copy of [place] in Chart?  No - copy requested    Hospital Utilization Over the Past 12 Months: # of hospitalizations or ER visits: 0 # of surgeries: 0  Review of Systems    Patient reports that his overall health is unchanged compared to last year.  History obtained from chart review and the patient  Patient Reported Readings (BP, Pulse, CBG, Weight,  etc) none  Pain Assessment Pain : No/denies pain     Current Medications & Allergies (verified) Allergies as of 09/17/2022   No Known Allergies      Medication List        Accurate as of September 17, 2022  8:22 AM. If you have any questions, ask your nurse or doctor.          aspirin EC 81 MG tablet Take 81 mg by mouth daily.   Cinnamon 500 MG capsule Take 500 mg by mouth daily.   fluoruracil 0.5 % cream Commonly known as: CARAC Apply topically daily. For 2-4 weeks, then no treatment for 2 weeks and then treat again for 2 weeks.   metoprolol tartrate 25 MG tablet Commonly known as: LOPRESSOR Take 1 tablet (25 mg total) by mouth 2 (two) times daily as needed. IF BP greater than 140/90.   Omega-3 Krill Oil 500 MG Caps Take by mouth.   rosuvastatin 20 MG tablet Commonly known as: CRESTOR Take 1 tablet (20 mg total) by mouth daily.   tamsulosin 0.4 MG Caps capsule Commonly known as: FLOMAX Take 1 capsule (0.4 mg total) by mouth daily.   valsartan-hydrochlorothiazide 320-12.5 MG tablet Commonly known as: DIOVAN-HCT Take 1 tablet by mouth daily.        History (reviewed): Past Medical History:  Diagnosis Date   Allergy    Arthritis 1976   Discovered in high school  moderate in hands   Asthma    Depression    On occasions   GERD (gastroesophageal reflux disease) 2019   First came from to much coffee, stopped coffee now  from spicy foods   Hyperlipidemia    Hypertension    OSA (obstructive sleep apnea)    Shoulder bursitis    Sleep apnea 2010   Diagnosis in 2010. Have had issues since childhood   Past Surgical History:  Procedure Laterality Date   ANTERIOR CRUCIATE LIGAMENT REPAIR  07/08/2003   ACL repair  RT 2011   SKIN CANCER EXCISION  04/08/2022   squamous cell carcinoma/ forehead left side   SKIN CANCER EXCISION  04/15/2022   basal cell carcinoma/nose   TONSILLECTOMY AND ADENOIDECTOMY     Family History  Problem Relation Age of Onset    Arthritis Mother    Cancer Father        pancreatic CA   Hypertension Father    Heart disease Sister    Heart disease Sister    Social History   Socioeconomic History   Marital status: Married    Spouse name: Prudencio Burly   Number of children: 0   Years of education: 16   Highest education level: Bachelor's degree (e.g., BA, AB, BS)  Occupational History   Occupation: Retired.  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   Other Topics Concern   Not on file  Social History Narrative   He lives with his wife. He has step-children. He is working out 6 days per weeks.  Retired form [employer]. He enjoys traveling and hiking.   Social Determinants of Health   Financial Resource Strain: Low Risk  (09/16/2022)   Overall Financial Resource Strain (CARDIA)    Difficulty of Paying Living Expenses: Not very hard  Food Insecurity: No Food Insecurity (09/16/2022)   Hunger Vital Sign    Worried About Running Out of Food in the Last Year: Never true    Ran Out of Food in the Last Year: Never true  Transportation Needs: No Transportation Needs (09/16/2022)   PRAPARE - Hydrologist (Medical): No    Lack of Transportation (Non-Medical): No  Physical Activity: Sufficiently Active (09/16/2022)   Exercise Vital Sign    Days of Exercise per Week: 5 days    Minutes of Exercise per Session: 90 min  Stress: No Stress Concern Present (09/16/2022)   [place]    Feeling of Stress : Only a little  Social Connections: Socially Isolated (09/17/2022)    Social Connection and Isolation Panel [NHANES]    Frequency of Communication with Friends and Family: Once a week    Frequency of Social Gatherings with Friends and Family: Once a week    Attends Religious Services: Never    Marine scientist or Organizations: No    Attends Archivist Meetings: Never    Marital Status: Married    Activities of Daily Living    09/17/2022    8:07 AM 09/16/2022    4:22 PM  In your present state of health, do you have any difficulty performing the following activities:  Hearing?  1  Comment problems with background noise  Vision?  0  Difficulty concentrating or making decisions?  0  Walking or climbing stairs?  0  Dressing or bathing?  0  Doing errands, shopping?  0  Preparing Food and eating ?  N  Using the Toilet?  N  In the past six months, have you accidently leaked urine?  Y  Do you have problems with loss of bowel control?  N  Managing your Medications?  N  Managing your Finances?  N  Housekeeping or managing your Housekeeping?  N    Patient Education/ Literacy How often do you need to have someone help you when you read instructions, pamphlets, or other written materials from your doctor or pharmacy?: 1 - Never What is the last grade level you completed in school?: Bachelor's degree  Exercise Current Exercise Habits: Home exercise routine, Type of exercise: walking, Time (Minutes): > 60, Frequency (Times/Week): 5, Weekly Exercise (Minutes/Week): 0, Intensity: Moderate, Exercise limited by: None identified  Diet Patient reports consuming 2 meals a day and 2 snack(s) a day Patient reports that his primary diet is: Regular Patient reports that she does have regular access to food.   Depression Screen    09/17/2022    8:07 AM 01/09/2017   10:33 AM 12/24/2015   11:17 AM  PHQ 2/9 Scores  PHQ - 2 Score 0 1 0     Fall Risk    09/17/2022    8:07 AM 09/16/2022    4:22 PM 12/24/2015   11:17 AM  Fall Risk   Falls in the past  year? 1 1 No  Number falls in past yr: 0 0   Injury with Fall? 0 0   Risk for fall due to : History of fall(s)    Follow up Falls evaluation completed;Education provided       Objective:  Mitchell Medina seemed alert and oriented and he participated appropriately during our telephone visit.  Blood Pressure Weight BMI  BP Readings from Last 3 Encounters:  10/17/21 (!) 160/66  07/19/18 (!) 188/84  01/09/17 (!) 150/76   Wt Readings from Last 3 Encounters:  10/17/21 259 lb (117.5 kg)  07/19/18 283 lb (128.4 kg)  01/09/17 267 lb (121.1 kg)   BMI Readings from Last 1 Encounters:  10/17/21 34.17 kg/m    *Unable to obtain current vital signs, weight, and BMI due to telephone visit type  Hearing/Vision  Mando did not seem to have difficulty with hearing/understanding during the telephone conversation Reports that he has had a formal eye exam by an eye care professional within the past year Reports that he has not had a formal hearing evaluation within the past year *Unable to fully assess hearing and vision during telephone visit type  Cognitive Function:    09/17/2022    8:13 AM  6CIT Screen  What Year? 0 points  What month? 0 points  What time? 0 points  Count back from 20 0 points  Months in reverse 0 points  Repeat phrase 0 points  Total Score 0 points   (Normal:0-7, Significant for Dysfunction: >8)  Normal Cognitive Function Screening: Yes   Immunization & Health Maintenance Record Immunization History  Administered Date(s) Administered   Fluad Quad(high Dose 65+) 02/26/2022   Influenza Split 05/04/2012   Influenza Whole 03/21/2008, 03/07/2011   Influenza,inj,Quad PF,6+ Mos 05/26/2013, 03/30/2017   Influenza,inj,Quad PF,6-35 Mos 03/10/2019   Influenza-Unspecified 07/10/2014, 04/14/2018   PFIZER Comirnaty(Gray Top)Covid-19 Tri-Sucrose Vaccine 04/03/2022   PFIZER(Purple Top)SARS-COV-2 Vaccination 04/06/2021   PNEUMOCOCCAL CONJUGATE-20  02/26/2022    Respiratory Syncytial Virus Vaccine,Recomb Aduvanted(Arexvy) 04/03/2022   Tdap 07/07/2009, 07/07/2020    Health Maintenance  Topic Date Due   COVID-19 Vaccine (3 - Pfizer risk series) 10/03/2022 (Originally 05/01/2022)   Zoster Vaccines- Shingrix (1 of 2) 01/17/2023 (Originally 08/10/1974)   COLONOSCOPY (Pts 45-80yr Insurance coverage will need to be confirmed)  09/17/2023 (Originally 09/10/2017)   Medicare Annual Wellness (AWV)  09/17/2023   DTaP/Tdap/Td (3 - Td or Tdap) 07/07/2030   Pneumonia Vaccine 67 Years old  Completed   INFLUENZA VACCINE  Completed   Hepatitis C Screening  Completed   HPV VACCINES  Aged Out       Assessment  This is a routine wellness examination for [employer]  Health Maintenance: Due or Overdue There are no preventive care reminders to display for this patient.   BNewman Nipdoes not need a referral for Community Assistance: Care Management:   no Social Work:    no Prescription Assistance:  no Nutrition/Diabetes Education:  no   Plan:  Personalized Goals  Goals Addressed               This Visit's Progress     Patient Stated (pt-stated)        Patient stated that he is just trying to stay fit.       Personalized Health Maintenance & Screening Recommendations  Colorectal cancer screening Shingrix vaccine  Lung Cancer Screening Recommended: no (Low Dose CT Chest recommended if Age 66-80years, 30 pack-year currently smoking OR have quit w/in past 15 years) Hepatitis C Screening recommended: no HIV Screening recommended: no  Advanced Directives: Written information was not prepared per patient's request.  Referrals & Orders No orders of the defined types were placed in this encounter.   Follow-up Plan Follow-up with MHali Marry MD as planned Patient will schedule colonoscopy or get cologuard order when he is back in town. Schedule shingles vaccine at the pharmacy. Medicare wellness in one year.  Patient  will access AVS on my chart.   I have personally reviewed and noted the following in the patient's chart:   Medical and social history Use of alcohol, tobacco or illicit drugs  Current medications and supplements Functional ability and status Nutritional status Physical activity Advanced directives List of other physicians Hospitalizations, surgeries, and ER visits in previous 12 months Vitals Screenings to include cognitive, depression, and falls Referrals and appointments  In addition, I have reviewed and discussed with BNewman Nipcertain preventive protocols, quality metrics, and best practice recommendations. A written personalized care plan for preventive services as well as general preventive health recommendations is available and can be mailed to the patient at his request.      BTinnie Gens RN BSN  09/17/2022

## 2022-09-17 NOTE — Patient Instructions (Signed)
[place] Maintenance Summary and Written Plan of Care  Mitchell Medina ,  Thank you for allowing me to perform your Medicare Annual Wellness Visit and for your ongoing commitment to your health.   Health Maintenance & Immunization History Health Maintenance  Topic Date Due   COVID-19 Vaccine (3 - Pfizer risk series) 10/03/2022 (Originally 05/01/2022)   Zoster Vaccines- Shingrix (1 of 2) 01/17/2023 (Originally 08/10/1974)   COLONOSCOPY (Pts 45-46yr Insurance coverage will need to be confirmed)  09/17/2023 (Originally 09/10/2017)   Medicare Annual Wellness (AWV)  09/17/2023   DTaP/Tdap/Td (3 - Td or Tdap) 07/07/2030   Pneumonia Vaccine 67 Years old  Completed   INFLUENZA VACCINE  Completed   Hepatitis C Screening  Completed   HPV VACCINES  Aged Out   Immunization History  Administered Date(s) Administered   Fluad Quad(high Dose 65+) 02/26/2022   Influenza Split 05/04/2012   Influenza Whole 03/21/2008, 03/07/2011   Influenza,inj,Quad PF,6+ Mos 05/26/2013, 03/30/2017   Influenza,inj,Quad PF,6-35 Mos 03/10/2019   Influenza-Unspecified 07/10/2014, 04/14/2018   PFIZER Comirnaty(Gray Top)Covid-19 Tri-Sucrose Vaccine 04/03/2022   PFIZER(Purple Top)SARS-COV-2 Vaccination 04/06/2021   PNEUMOCOCCAL CONJUGATE-20 02/26/2022   Respiratory Syncytial Virus Vaccine,Recomb Aduvanted(Arexvy) 04/03/2022   Tdap 07/07/2009, 07/07/2020    These are the patient goals that we discussed:  Goals Addressed               This Visit's Progress     Patient Stated (pt-stated)        Patient stated that he is just trying to stay fit.         This is a list of Health Maintenance Items that are overdue or due now: Colorectal cancer screening Shingrix vaccine    Orders/Referrals Placed Today: No orders of the defined types were placed in this encounter.  (Contact our referral department at [phone] you have not spoken with someone about your referral appointment  within the next 5 days)    Follow-up Plan Follow-up with Mitchell Medina as planned Patient will schedule colonoscopy or get cologuard order when he is back in town. Schedule shingles vaccine at the pharmacy. Medicare wellness in one year.  Patient will access AVS on my chart.      Colonoscopy, Adult A colonoscopy is a procedure to look at the entire large intestine. This procedure is done using a long, thin, flexible tube that has a camera on the end. You may have a colonoscopy: As a part of normal colorectal screening. If you have certain symptoms, such as: A low number of red blood cells in your blood (anemia). Diarrhea that does not go away. Pain in your abdomen. Blood in your stool. A colonoscopy can help screen for and diagnose medical problems, including: An abnormal growth of cells or tissue (tumor). Abnormal growths within the lining of your intestine (polyps). Inflammation. Areas of bleeding. Tell your health care provider about: Any allergies you have. All medicines you are taking, including vitamins, herbs, eye drops, creams, and over-the-counter medicines. Any problems you or family members have had with anesthetic medicines. Any bleeding problems you have. Any surgeries you have had. Any medical conditions you have. Any problems you have had with having bowel movements. Whether you are pregnant or may be pregnant. What are the risks? Generally, this is a safe procedure. However, problems may occur, including: Bleeding. Damage to your intestine. Allergic reactions to medicines given during the procedure. Infection. This is rare. What happens before the procedure? Eating and drinking restrictions  Follow instructions from your health care provider about eating or drinking restrictions, which may include: A few days before the procedure: Follow a low-fiber diet. Avoid nuts, seeds, dried fruit, raw fruits, and vegetables. 1-3 days before the  procedure: Eat only gelatin dessert or ice pops. Drink only clear liquids, such as water, clear juice, clear broth or bouillon, black coffee or tea, or clear soft drinks or sports drinks. Avoid liquids that contain red or purple dye. The day of the procedure: Do not eat solid foods. You may continue to drink clear liquids until up to 2 hours before the procedure. Do not eat or drink anything starting 2 hours before the procedure, or within the time period that your health care provider recommends. Bowel prep If you were prescribed a bowel prep to take by mouth (orally) to clean out your colon: Take it as told by your health care provider. Starting the day before your procedure, you will need to drink a large amount of liquid medicine. The liquid will cause you to have many bowel movements of loose stool until your stool becomes almost clear or light green. If your skin or the opening between the buttocks (anus) gets irritated from diarrhea, you may relieve the irritation using: Wipes with medicine in them, such as adult wet wipes with aloe and vitamin E. A product to soothe skin, such as petroleum jelly. If you vomit while drinking the bowel prep: Take a break for up to 60 minutes. Begin the bowel prep again. Call your health care provider if you keep vomiting or you cannot take the bowel prep without vomiting. To clean out your colon, you may also be given: Laxative medicines. These help you have a bowel movement. Instructions for enema use. An enema is liquid medicine injected into your rectum. Medicines Ask your health care provider about: Changing or stopping your regular medicines or supplements. This is especially important if you are taking iron supplements, diabetes medicines, or blood thinners. Taking medicines such as aspirin and ibuprofen. These medicines can thin your blood. Do not take these medicines unless your health care provider tells you to take them. Taking  over-the-counter medicines, vitamins, herbs, and supplements. General instructions Ask your health care provider what steps will be taken to help prevent infection. These may include washing skin with a germ-killing soap. If you will be going home right after the procedure, plan to have a responsible adult: Take you home from the hospital or clinic. You will not be allowed to drive. Care for you for the time you are told. What happens during the procedure?  An IV will be inserted into one of your veins. You will be given a medicine to make you fall asleep (general anesthetic). You will lie on your side with your knees bent. A lubricant will be put on the tube. Then the tube will be: Inserted into your anus. Gently eased through all parts of your large intestine. Air will be sent into your colon to keep it open. This may cause some pressure or cramping. Images will be taken with the camera and will appear on a screen. A small tissue sample may be removed to be looked at under a microscope (biopsy). The tissue may be sent to a lab for testing if any signs of problems are found. If small polyps are found, they may be removed and checked for cancer cells. When the procedure is finished, the tube will be removed. The procedure may vary among health care providers  and hospitals. What happens after the procedure? Your blood pressure, heart rate, breathing rate, and blood oxygen level will be monitored until you leave the hospital or clinic. You may have a small amount of blood in your stool. You may pass gas and have mild cramping or bloating in your abdomen. This is caused by the air that was used to open your colon during the exam. If you were given a sedative during the procedure, it can affect you for several hours. Do not drive or operate machinery until your health care provider says that it is safe. It is up to you to get the results of your procedure. Ask your health care provider, or the  department that is doing the procedure, when your results will be ready. Summary A colonoscopy is a procedure to look at the entire large intestine. Follow instructions from your health care provider about eating and drinking before the procedure. If you were prescribed an oral bowel prep to clean out your colon, take it as told by your health care provider. During the colonoscopy, a flexible tube with a camera on its end is inserted into the anus and then passed into all parts of the large intestine. This information is not intended to replace advice given to you by your health care provider. Make sure you discuss any questions you have with your health care provider. Document Revised: 06/17/2021 Document Reviewed: 02/13/2021 Elsevier Patient Education  [place] Maintenance, Male Adopting a healthy lifestyle and getting preventive care are important in promoting health and wellness. Ask your health care provider about: The right schedule for you to have regular tests and exams. Things you can do on your own to prevent diseases and keep yourself healthy. What should I know about diet, weight, and exercise? Eat a healthy diet  Eat a diet that includes plenty of vegetables, fruits, low-fat dairy products, and lean protein. Do not eat a lot of foods that are high in solid fats, added sugars, or sodium. Maintain a healthy weight Body mass index (BMI) is a measurement that can be used to identify possible weight problems. It estimates body fat based on height and weight. Your health care provider can help determine your BMI and help you achieve or maintain a healthy weight. Get regular exercise Get regular exercise. This is one of the most important things you can do for your health. Most adults should: Exercise for at least 150 minutes each week. The exercise should increase your heart rate and make you sweat (moderate-intensity exercise). Do strengthening exercises at least  twice a week. This is in addition to the moderate-intensity exercise. Spend less time sitting. Even light physical activity can be beneficial. Watch cholesterol and blood lipids Have your blood tested for lipids and cholesterol at 66 years of age, then have this test every 5 years. You may need to have your cholesterol levels checked more often if: Your lipid or cholesterol levels are high. You are older than 66 years of age. You are at high risk for heart disease. What should I know about cancer screening? Many types of cancers can be detected early and may often be prevented. Depending on your health history and family history, you may need to have cancer screening at various ages. This may include screening for: Colorectal cancer. Prostate cancer. Skin cancer. Lung cancer. What should I know about heart disease, diabetes, and high blood pressure? Blood pressure and heart disease High blood pressure causes heart disease and  increases the risk of stroke. This is more likely to develop in people who have high blood pressure readings or are overweight. Talk with your health care provider about your target blood pressure readings. Have your blood pressure checked: Every 3-5 years if you are 38-79 years of age. Every year if you are 44 years old or older. If you are between the ages of 30 and 71 and are a current or former smoker, ask your health care provider if you should have a one-time screening for abdominal aortic aneurysm (AAA). Diabetes Have regular diabetes screenings. This checks your fasting blood sugar level. Have the screening done: Once every three years after age 79 if you are at a normal weight and have a low risk for diabetes. More often and at a younger age if you are overweight or have a high risk for diabetes. What should I know about preventing infection? Hepatitis B If you have a higher risk for hepatitis B, you should be screened for this virus. Talk with your health  care provider to find out if you are at risk for hepatitis B infection. Hepatitis C Blood testing is recommended for: Everyone born from [birth year] through 1964. Anyone with known risk factors for hepatitis C. Sexually transmitted infections (STIs) You should be screened each year for STIs, including gonorrhea and chlamydia, if: You are sexually active and are younger than 66 years of age. You are older than 66 years of age and your health care provider tells you that you are at risk for this type of infection. Your sexual activity has changed since you were last screened, and you are at increased risk for chlamydia or gonorrhea. Ask your health care provider if you are at risk. Ask your health care provider about whether you are at high risk for HIV. Your health care provider may recommend a prescription medicine to help prevent HIV infection. If you choose to take medicine to prevent HIV, you should first get tested for HIV. You should then be tested every 3 months for as long as you are taking the medicine. Follow these instructions at home: Alcohol use Do not drink alcohol if your health care provider tells you not to drink. If you drink alcohol: Limit how much you have to 0-2 drinks a day. Know how much alcohol is in your drink. In the [place] one drink equals one 12 oz bottle of beer (355 mL), one 5 oz glass of wine (148 mL), or one 1 oz glass of hard liquor (44 mL). Lifestyle Do not use any products that contain nicotine or tobacco. These products include cigarettes, chewing tobacco, and vaping devices, such as e-cigarettes. If you need help quitting, ask your health care provider. Do not use street drugs. Do not share needles. Ask your health care provider for help if you need support or information about quitting drugs. General instructions Schedule regular health, dental, and eye exams. Stay current with your vaccines. Tell your health care provider if: You often feel depressed. You  have ever been abused or do not feel safe at home. Summary Adopting a healthy lifestyle and getting preventive care are important in promoting health and wellness. Follow your health care provider's instructions about healthy diet, exercising, and getting tested or screened for diseases. Follow your health care provider's instructions on monitoring your cholesterol and blood pressure. This information is not intended to replace advice given to you by your health care provider. Make sure you discuss any questions you have with your  health care provider. Document Revised: 11/12/2020 Document Reviewed: 11/12/2020 Elsevier Patient Education  [place].

## 2022-10-02 ENCOUNTER — Encounter: Payer: Self-pay | Admitting: Family Medicine

## 2022-12-23 ENCOUNTER — Telehealth: Payer: Self-pay

## 2022-12-23 DIAGNOSIS — G4733 Obstructive sleep apnea (adult) (pediatric): Secondary | ICD-10-CM

## 2022-12-23 NOTE — Telephone Encounter (Signed)
Magnus Sinning sent a MyChart message about the need for a home sleep study test. The is one in the chart from last year. Is it ok to order another home sleep study?  Pended order.

## 2022-12-23 NOTE — Addendum Note (Signed)
Addended by: Chalmers Cater on: 12/23/2022 01:09 PM   Modules accepted: Orders

## 2023-01-20 ENCOUNTER — Telehealth: Payer: Self-pay | Admitting: Family Medicine

## 2023-01-20 NOTE — Telephone Encounter (Signed)
Contacted [employer] at [phone] to check PA status for CPT code G0399 Home sleep study( Spoke with [place]). No PA required, reference #[id number].

## 2023-01-20 NOTE — Telephone Encounter (Signed)
Received incoming call from [facility], patients insurance [employer] requires PA to be completed by PCP office. CPT code: [phone].

## 2023-04-22 ENCOUNTER — Encounter: Payer: Self-pay | Admitting: Family Medicine

## 2023-04-22 ENCOUNTER — Ambulatory Visit: Payer: Medicare PPO | Admitting: Family Medicine

## 2023-04-22 VITALS — BP 138/76 | HR 58 | Ht 73.0 in | Wt 248.0 lb

## 2023-04-22 DIAGNOSIS — R7301 Impaired fasting glucose: Secondary | ICD-10-CM | POA: Diagnosis not present

## 2023-04-22 DIAGNOSIS — E785 Hyperlipidemia, unspecified: Secondary | ICD-10-CM

## 2023-04-22 DIAGNOSIS — Z23 Encounter for immunization: Secondary | ICD-10-CM | POA: Diagnosis not present

## 2023-04-22 DIAGNOSIS — Z125 Encounter for screening for malignant neoplasm of prostate: Secondary | ICD-10-CM

## 2023-04-22 DIAGNOSIS — N401 Enlarged prostate with lower urinary tract symptoms: Secondary | ICD-10-CM

## 2023-04-22 DIAGNOSIS — I10 Essential (primary) hypertension: Secondary | ICD-10-CM

## 2023-04-22 DIAGNOSIS — H9311 Tinnitus, right ear: Secondary | ICD-10-CM

## 2023-04-22 LAB — POCT GLYCOSYLATED HEMOGLOBIN (HGB A1C): Hemoglobin A1C: 5.6 % (ref 4.0–5.6)

## 2023-04-22 MED ORDER — VALSARTAN-HYDROCHLOROTHIAZIDE 320-12.5 MG PO TABS: 1.0000 | ORAL_TABLET | Freq: Every day | ORAL | 1 refills | Status: DC

## 2023-04-22 MED ORDER — METOPROLOL TARTRATE 25 MG PO TABS: 25.0000 mg | ORAL_TABLET | Freq: Two times a day (BID) | ORAL | 1 refills | Status: AC | PRN

## 2023-04-22 MED ORDER — VALSARTAN-HYDROCHLOROTHIAZIDE 320-12.5 MG PO TABS: 1.0000 | ORAL_TABLET | Freq: Every day | ORAL | 3 refills | Status: AC

## 2023-04-22 MED ORDER — FINASTERIDE 5 MG PO TABS: 5.0000 mg | ORAL_TABLET | Freq: Every day | ORAL | 3 refills | Status: AC

## 2023-04-22 MED ORDER — ROSUVASTATIN CALCIUM 20 MG PO TABS: 20.0000 mg | ORAL_TABLET | Freq: Every day | ORAL | 3 refills | Status: AC

## 2023-04-22 MED ORDER — TAMSULOSIN HCL 0.4 MG PO CAPS: 0.4000 mg | ORAL_CAPSULE | Freq: Every day | ORAL | 3 refills | Status: AC

## 2023-04-22 NOTE — Progress Notes (Signed)
Established Patient Office Visit  Subjective   Patient ID: Mitchell Medina, male    DOB: 01/10/1956  Age: 67 y.o. MRN: [id number]  Chief Complaint  Patient presents with   Tinnitus    HPI He is here today for ear ringing that is been going on for a couple of months.  He says it can be constant for days even a couple of weeks and then he might get a temporary break for a few days but then it seems to come back it is always in the right ear.  No recurrent loud noise exposure over his lifetime no recent loud noise exposure.  He said he has always had a little bit of a difficult time as an adult hearing when there are loud noises around.  But no recent abrupt or significant change in hearing in the last couple of weeks.  No popping or pressure in the ears and no pain or discomfort.  No ear discharge.  Hypertension- Pt denies chest pain, SOB, dizziness, or heart palpitations.  Taking meds as directed w/o problems.  Denies medication side effects.       ROS    Objective:     BP 138/76   Pulse (!) 58   Ht 6\' 1"  (1.854 m)   Wt 248 lb (112.5 kg)   SpO2 98%   BMI 32.72 kg/m    Physical Exam Vitals and nursing note reviewed.  Constitutional:      Appearance: Normal appearance.  HENT:     Head: Normocephalic and atraumatic.     Right Ear: Tympanic membrane, ear canal and external ear normal. There is no impacted cerumen.     Left Ear: Tympanic membrane, ear canal and external ear normal. There is no impacted cerumen.     Nose: Nose normal.     Mouth/Throat:     Pharynx: Oropharynx is clear.  Eyes:     Conjunctiva/sclera: Conjunctivae normal.  Cardiovascular:     Rate and Rhythm: Normal rate and regular rhythm.  Pulmonary:     Effort: Pulmonary effort is normal.     Breath sounds: Normal breath sounds.  Musculoskeletal:     Cervical back: Neck supple. No tenderness.  Lymphadenopathy:     Cervical: No cervical adenopathy.  Skin:    General: Skin is warm and dry.   Neurological:     Mental Status: He is alert and oriented to person, place, and time.  Psychiatric:        Mood and Affect: Mood normal.      Results for orders placed or performed in visit on 04/22/23  POCT HgB A1C  Result Value Ref Range   Hemoglobin A1C 5.6 4.0 - 5.6 %   HbA1c POC (<> result, manual entry)     HbA1c, POC (prediabetic range)     HbA1c, POC (controlled diabetic range)        The 10-year ASCVD risk score (Arnett DK, et al., 2019) is: 18.6%    Assessment & Plan:   Problem List Items Addressed This Visit       Cardiovascular and Mediastinum   Essential hypertension, benign - Primary    Initial blood pressure quite elevated but repeat was much better.  He did take his medications this morning and says that he typically gets even better blood pressure readings at home.      Relevant Medications   metoprolol tartrate (LOPRESSOR) 25 MG tablet   rosuvastatin (CRESTOR) 20 MG tablet   valsartan-hydrochlorothiazide (DIOVAN-HCT)  320-12.5 MG tablet   Other Relevant Orders   CMP14+EGFR   Lipid panel   CBC   PSA     Endocrine   Impaired fasting glucose    1C is 5.6 today which looks great.  Does take cinnamon capsules occasionally but not consistently.  Lab Results  Component Value Date   HGBA1C 5.6 04/22/2023         Relevant Medications   rosuvastatin (CRESTOR) 20 MG tablet   valsartan-hydrochlorothiazide (DIOVAN-HCT) 320-12.5 MG tablet   Other Relevant Orders   CMP14+EGFR   Lipid panel   CBC   PSA   POCT HgB A1C (Completed)     Genitourinary   BPH (benign prostatic hyperplasia)    Has been having more symptoms including frequency, urgency and even a little bit of urinary leakage at times.  He feels like the Flomax is not working as well as it once was.  We did discuss options including adding finasteride.  Recommend a trial for 6 months to see if this is helpful we did discuss that it can take several months for this medication to start to show  efficacy.  If no significant improvement after 6 months then we could consider treatment for overactive bladder.  Will check a PSA on labs today.      Relevant Medications   tamsulosin (FLOMAX) 0.4 MG CAPS capsule   finasteride (PROSCAR) 5 MG tablet   Other Relevant Orders   CMP14+EGFR   Lipid panel   CBC   PSA     Other   Dyslipidemia    Due to recheck lipid levels.      Relevant Medications   rosuvastatin (CRESTOR) 20 MG tablet   Other Visit Diagnoses     Encounter for immunization       Relevant Orders   Flu Vaccine Trivalent High Dose (Fluad) (Completed)   Screening for malignant neoplasm of prostate       Relevant Orders   PSA   Tinnitus, right ear       Relevant Orders   Ambulatory referral to ENT       Tinnitus-ear exam actually looks good today and is very reassuring.  We did discuss that often times we do not find a specific cause for ear ringing.  But I would recommend a formal workup by ENT just to rule out anything that could be preventable or treated.  It sounds like he is had some hearing testing years ago.  Return in about 3 months (around 07/23/2023) for bp.    Nani Gasser, MD

## 2023-04-22 NOTE — Assessment & Plan Note (Signed)
Due to recheck lipid levels.

## 2023-04-22 NOTE — Assessment & Plan Note (Addendum)
1C is 5.6 today which looks great.  Does take cinnamon capsules occasionally but not consistently.  Lab Results  Component Value Date   HGBA1C 5.6 04/22/2023

## 2023-04-22 NOTE — Assessment & Plan Note (Signed)
Initial blood pressure quite elevated but repeat was much better.  He did take his medications this morning and says that he typically gets even better blood pressure readings at home.

## 2023-04-22 NOTE — Assessment & Plan Note (Signed)
Has been having more symptoms including frequency, urgency and even a little bit of urinary leakage at times.  He feels like the Flomax is not working as well as it once was.  We did discuss options including adding finasteride.  Recommend a trial for 6 months to see if this is helpful we did discuss that it can take several months for this medication to start to show efficacy.  If no significant improvement after 6 months then we could consider treatment for overactive bladder.  Will check a PSA on labs today.

## 2023-04-23 LAB — CMP14+EGFR
ALT: 23 [IU]/L (ref 0–44)
AST: 40 [IU]/L (ref 0–40)
Albumin: 4.6 g/dL (ref 3.9–4.9)
Alkaline Phosphatase: 83 [IU]/L (ref 44–121)
BUN/Creatinine Ratio: 13 (ref 10–24)
BUN: 14 mg/dL (ref 8–27)
Bilirubin Total: 0.9 mg/dL (ref 0.0–1.2)
CO2: 25 mmol/L (ref 20–29)
Calcium: 9.8 mg/dL (ref 8.6–10.2)
Chloride: 98 mmol/L (ref 96–106)
Creatinine, Ser: 1.08 mg/dL (ref 0.76–1.27)
Globulin, Total: 2.8 g/dL (ref 1.5–4.5)
Glucose: 101 mg/dL — ABNORMAL HIGH (ref 70–99)
Potassium: 4 mmol/L (ref 3.5–5.2)
Sodium: 140 mmol/L (ref 134–144)
Total Protein: 7.4 g/dL (ref 6.0–8.5)
eGFR: 76 mL/min/{1.73_m2} (ref >59)

## 2023-04-23 LAB — LIPID PANEL
Chol/HDL Ratio: 2.8 {ratio} (ref 0.0–5.0)
Cholesterol, Total: 161 mg/dL (ref 100–199)
HDL: 57 mg/dL (ref >39)
LDL Chol Calc (NIH): 91 mg/dL (ref 0–99)
Triglycerides: 68 mg/dL (ref 0–149)
VLDL Cholesterol Cal: 13 mg/dL (ref 5–40)

## 2023-04-23 LAB — CBC
Hematocrit: 47.2 % (ref 37.5–51.0)
Hemoglobin: 15.8 g/dL (ref 13.0–17.7)
MCH: 28.9 pg (ref 26.6–33.0)
MCHC: 33.5 g/dL (ref 31.5–35.7)
MCV: 86 fL (ref 79–97)
Platelets: 222 10*3/uL (ref 150–450)
RBC: 5.46 x10E6/uL (ref 4.14–5.80)
RDW: 13 % (ref 11.6–15.4)
WBC: 8.4 10*3/uL (ref 3.4–10.8)

## 2023-04-23 LAB — PSA: Prostate Specific Ag, Serum: 0.3 ng/mL (ref 0.0–4.0)

## 2023-04-23 NOTE — Progress Notes (Signed)
Hi Kamen, metabolic panel overall looks good.  Glucose was just borderline.  Cholesterol including LDL looks good.  Blood counts normal.  Prostate test is normal as well.

## 2023-04-27 ENCOUNTER — Ambulatory Visit: Payer: Medicare PPO | Admitting: Family Medicine

## 2023-09-21 ENCOUNTER — Ambulatory Visit (INDEPENDENT_AMBULATORY_CARE_PROVIDER_SITE_OTHER): Payer: Medicare PPO

## 2023-09-21 VITALS — Ht 73.0 in | Wt 250.0 lb

## 2023-09-21 DIAGNOSIS — Z0000 Encounter for general adult medical examination without abnormal findings: Secondary | ICD-10-CM

## 2023-09-21 NOTE — Patient Instructions (Signed)
  Mr. Abair , Thank you for taking time to come for your Medicare Wellness Visit. I appreciate your ongoing commitment to your health goals. Please review the following plan we discussed and let me know if I can assist you in the future.   These are the goals we discussed:  Goals       Patient Stated (pt-stated)      Patient stated that he is just trying to stay fit.      Weight (lb) < 230 lb (104.3 kg)      He would like to lose more weight.         This is a list of the screening recommended for you and due dates:  Health Maintenance  Topic Date Due   Zoster (Shingles) Vaccine (1 of 2) Never done   Colon Cancer Screening  09/10/2017   COVID-19 Vaccine (3 - Pfizer risk series) 05/01/2022   Medicare Annual Wellness Visit  09/20/2024   DTaP/Tdap/Td vaccine (3 - Td or Tdap) 07/07/2030   Pneumonia Vaccine  Completed   Flu Shot  Completed   Hepatitis C Screening  Completed   HPV Vaccine  Aged Out

## 2023-09-21 NOTE — Progress Notes (Signed)
 Subjective:   Mitchell Medina is a 68 y.o. male who presents for Medicare Annual/Subsequent preventive examination.  Visit Complete: Virtual I connected with  Mitchell Medina on 09/21/23 by a audio enabled telemedicine application and verified that I am speaking with the correct person using two identifiers.  Patient Location: Home  Provider Location: Office/Clinic  I discussed the limitations of evaluation and management by telemedicine. The patient expressed understanding and agreed to proceed.  Vital Signs: Because this visit was a virtual/telehealth visit, some criteria may be missing or patient reported. Any vitals not documented were not able to be obtained and vitals that have been documented are patient reported.  Patient Medicare AWV questionnaire was completed by the patient on n/a; I have confirmed that all information answered by patient is correct and no changes since this date.  Cardiac Risk Factors include: advanced age (>70men, >74 women);obesity (BMI >30kg/m2);male gender;hypertension;dyslipidemia;family history of premature cardiovascular disease     Objective:    Today's Vitals   09/21/23 0802  Weight: 250 lb (113.4 kg)  Height: 6\' 1"  (1.854 m)   Body mass index is 32.98 kg/m.     09/21/2023    8:11 AM 09/17/2022    8:07 AM 09/01/2014    8:42 AM  Advanced Directives  Does Patient Have a Medical Advance Directive? No;Yes Yes Yes  Type of Estate agent of [place];Living will Living will Healthcare Power of [place];Living will  Does patient want to make changes to medical advance directive? No - Patient declined No - Patient declined No - Patient declined  Copy of Healthcare Power of Attorney in Chart? No - copy requested  No - copy requested  Would patient like information on creating a medical advance directive? No - Patient declined      Current Medications (verified) Outpatient Encounter Medications as of 09/21/2023  Medication  Sig   aspirin 81 MG EC tablet Take 81 mg by mouth daily.     Cinnamon 500 MG capsule Take 500 mg by mouth daily.   finasteride (PROSCAR) 5 MG tablet Take 1 tablet (5 mg total) by mouth daily.   metoprolol tartrate (LOPRESSOR) 25 MG tablet Take 1 tablet (25 mg total) by mouth 2 (two) times daily as needed. IF BP greater than 140/90.   rosuvastatin (CRESTOR) 20 MG tablet Take 1 tablet (20 mg total) by mouth daily.   tamsulosin (FLOMAX) 0.4 MG CAPS capsule Take 1 capsule (0.4 mg total) by mouth daily.   valsartan-hydrochlorothiazide (DIOVAN-HCT) 320-12.5 MG tablet Take 1 tablet by mouth daily.   No facility-administered encounter medications on file as of 09/21/2023.    Allergies (verified) Patient has no known allergies.   History: Past Medical History:  Diagnosis Date   Allergy    Arthritis 1976   Discovered in high school  moderate in hands   Asthma    Depression    On occasions   GERD (gastroesophageal reflux disease) 2019   First came from to much coffee, stopped coffee now from spicy foods   Hyperlipidemia    Hypertension    OSA (obstructive sleep apnea)    Shoulder bursitis    Sleep apnea 2010   Diagnosis in 2010. Have had issues since childhood   Past Surgical History:  Procedure Laterality Date   ANTERIOR CRUCIATE LIGAMENT REPAIR  07/08/2003   ACL repair  RT 2011   SKIN CANCER EXCISION  04/08/2022   squamous cell carcinoma/ forehead left side   SKIN CANCER EXCISION  04/15/2022   basal cell carcinoma/nose   TONSILLECTOMY AND ADENOIDECTOMY     Family History  Problem Relation Age of Onset   Arthritis Mother    Cancer Father        pancreatic CA   Hypertension Father    Heart disease Sister    Heart disease Sister    Social History   Socioeconomic History   Marital status: Married    Spouse name: Magnus Sinning   Number of children: 0   Years of education: 16   Highest education level: Bachelor's degree (e.g., BA, AB, BS)  Occupational History    Occupation: Retired.  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   Other Topics Concern   Not on file  Social History Narrative   He is separated from his wife. He is working out 6 days per weeks.  Retired form [employer]. He enjoys hiking.   Social Drivers of Corporate investment banker Strain: Low Risk  (09/21/2023)   Overall Financial Resource Strain (CARDIA)    Difficulty of Paying Living Expenses: Not very hard  Food Insecurity: No Food Insecurity (09/21/2023)   Hunger Vital Sign    Worried About Running Out of Food in the Last Year: Never true    Ran Out of Food in the Last Year: Never true  Transportation Needs: No Transportation Needs (09/21/2023)   PRAPARE - Administrator, Civil Service (Medical): No    Lack of Transportation (Non-Medical): No  Physical Activity: Sufficiently Active (09/21/2023)   Exercise Vital Sign    Days of Exercise per Week: 7 days    Minutes of Exercise per Session: 90 min  Stress: No Stress Concern Present (09/21/2023)   [employer] of Occupational Health - Occupational Stress Questionnaire    Feeling of Stress : Only a little  Social Connections: Socially Isolated (09/21/2023)   Social Connection and Isolation Panel [NHANES]    Frequency of Communication with Friends and Family: Once a week    Frequency of Social Gatherings with Friends and Family: Once a week    Attends Religious Services: Never    Database administrator or Organizations: No    Attends Engineer, structural: 1 to 4 times per year    Marital Status: Separated    Tobacco Counseling Counseling  given: Not Answered   Clinical Intake:  Pre-visit preparation completed: Yes  Pain : No/denies pain     BMI - recorded: 32.98 Nutritional Status: BMI > 30  Obese Nutritional Risks: None Diabetes: No  How often do you need to have someone help you when you read instructions, pamphlets, or other written materials from your doctor or pharmacy?: 1 - Never What is the last grade level you completed in school?: 16  Interpreter Needed?: No      Activities of Daily Living    09/21/2023    8:03 AM  In your present state of health, do you have any difficulty performing the following activities:  Hearing? 1  Comment in large groups  Vision? 0  Difficulty concentrating or making decisions? 0  Walking or climbing stairs? 0  Dressing or bathing? 0  Doing errands, shopping? 0  Preparing Food and eating ? N  Using the Toilet? N  In the past six months, have you accidently leaked urine? N  Do you have problems with loss of bowel control? N  Managing your Medications? N  Managing your Finances? N  Housekeeping or managing your Housekeeping? N    Patient Care Team: Mitchell Games, MD as PCP - General (Family Medicine) Monica Becton, MD as Consulting Physician (Sports Medicine)  Indicate any recent Medical Services you may have received from other than [facility] providers in the past year (date may be approximate).     Assessment:   This is a routine wellness examination for Mitchell Medina.  Hearing/Vision screen No results found.   Goals Addressed             This Visit's Progress    Weight (lb) < 230 lb (104.3 kg)   250 lb (113.4 kg)    He would like to lose more weight.       Depression Screen    09/21/2023    8:10 AM 09/17/2022    8:07 AM 01/09/2017   10:33 AM 12/24/2015   11:17 AM  PHQ 2/9 Scores  PHQ - 2 Score 0 0 1 0    Fall Risk    09/21/2023    8:12 AM 09/17/2022    8:07 AM 09/16/2022    4:22 PM 12/24/2015   11:17 AM  Fall Risk   Falls in the  past year? 1 1 1  No  Number falls in past yr: 0 0 0   Injury with Fall? 0 0 0   Risk for fall due to : No Fall Risks History of fall(s)    Follow up Falls evaluation completed Falls evaluation completed;Education provided      MEDICARE RISK AT HOME: Medicare Risk at Home Any stairs in or around the home?: No Home free of loose throw rugs in walkways, pet beds, electrical cords, etc?: Yes Adequate lighting in your home to reduce risk of falls?: Yes Life alert?: No Use of a cane, walker or w/c?: No Grab bars in the bathroom?: Yes Shower chair or bench in shower?: Yes Elevated toilet seat or a handicapped toilet?: No  TIMED UP AND GO:  Was the test performed?  No    Cognitive Function:        09/21/2023    8:13 AM 09/17/2022    8:13 AM  6CIT Screen  What Year? 0 points 0 points  What month? 0 points 0 points  What time? 0 points 0 points  Count back from 20 0 points 0 points  Months in reverse 0 points 0 points  Repeat phrase 0 points 0 points  Total Score 0 points 0 points    Immunizations Immunization History  Administered Date(s) Administered   Fluad Quad(high Dose 65+) 02/26/2022   Fluad Trivalent(High Dose 65+) 04/22/2023   Influenza Split 05/04/2012   Influenza Whole 03/21/2008, 03/07/2011   Influenza,inj,Quad PF,6+ Mos 05/26/2013, 03/30/2017   Influenza,inj,Quad PF,6-35 Mos 03/10/2019   Influenza-Unspecified 07/10/2014, 04/14/2018   PFIZER Comirnaty(Gray Top)Covid-19 Tri-Sucrose Vaccine 04/03/2022   PFIZER(Purple Top)SARS-COV-2 Vaccination 04/06/2021   PNEUMOCOCCAL CONJUGATE-20 02/26/2022   Respiratory Syncytial Virus Vaccine,Recomb Aduvanted(Arexvy) 04/03/2022   Tdap 07/07/2009, 07/07/2020    TDAP status: Up to date  Flu Vaccine status: Up to date  Pneumococcal vaccine status: Up to date  Covid-19 vaccine status: Information provided on how to obtain vaccines.   Qualifies for Shingles Vaccine? Yes   Zostavax completed No   Shingrix Completed?: No.     Education has been provided regarding the importance of this vaccine. Patient has been advised to call insurance company to determine out of pocket expense if they have not yet received this vaccine. Advised may also receive vaccine at local pharmacy or Health Dept. Verbalized acceptance and understanding.  Screening Tests Health Maintenance  Topic Date Due   Zoster Vaccines- Shingrix (1 of 2) Never done   Colonoscopy  09/10/2017   COVID-19 Vaccine (3 - Pfizer risk series) 05/01/2022   Medicare Annual Wellness (AWV)  09/20/2024   DTaP/Tdap/Td (3 - Td or Tdap) 07/07/2030   Pneumonia Vaccine 62+ Years old  Completed   INFLUENZA VACCINE  Completed   Hepatitis C Screening  Completed   HPV VACCINES  Aged Out    Health Maintenance  Health Maintenance Due  Topic Date Due   Zoster Vaccines- Shingrix (1 of 2) Never done   Colonoscopy  09/10/2017   COVID-19 Vaccine (3 - Pfizer risk series) 05/01/2022    Colorectal cancer screening: Type of screening: Colonoscopy. Completed 09/11/2007. Repeat every 10 years Patient declined referral.   Lung Cancer Screening: (Low Dose CT Chest recommended if Age 67-80 years, 20 pack-year currently smoking OR have quit w/in 15years.) does not qualify.   Lung Cancer Screening Referral: n/a  Additional Screening:  Hepatitis C Screening: does qualify; Completed 12/21/2015  Vision Screening: Recommended annual ophthalmology exams for early detection of glaucoma and other disorders of the eye. Is the patient up to date with their annual eye exam?  Yes  Who is the provider or what is the name of the office in which the patient attends annual eye exams? Patient doesn't remember.  If pt is not established with a provider, would they like to be referred to a provider to establish care?  N/a .   Dental Screening: Recommended annual dental exams for proper oral hygiene   Community Resource Referral / Chronic Care Management: CRR required this visit?  No   CCM  required this visit?  No     Plan:     I have personally reviewed and noted the following in the patient's chart:   Medical and social history Use of alcohol, tobacco or illicit drugs  Current medications and supplements including opioid prescriptions. Patient is not currently taking opioid prescriptions. Functional ability and status Nutritional status Physical activity Advanced directives List of other physicians Hospitalizations, surgeries, and ER visits in previous 12 months Vitals Screenings to include cognitive, depression, and falls Referrals and appointments  In addition, I have reviewed and discussed with patient certain preventive protocols, quality metrics, and best practice recommendations. A written personalized care plan for preventive services as well as general preventive health recommendations were provided to patient.     Esmond Harps, CMA   09/21/2023   After Visit Summary: (MyChart) Due to this being a telephonic visit, the after visit summary with patients personalized plan was offered to patient via MyChart   Nurse Notes:   Mitchell Medina is a 68 y.o. male patient of Metheney, Barbarann Ehlers, MD who had a Medicare Annual Wellness Visit today via telephone. Mitchell Medina is Retired. He is moderately physically active and enjoys hiking.

## 2024-09-21 ENCOUNTER — Ambulatory Visit
